# Patient Record
Sex: Male | Born: 1945
Health system: Southern US, Community
[De-identification: ages and names within clinical notes are randomized; demographics above are authoritative.]

## PROBLEM LIST (undated history)

## (undated) DIAGNOSIS — K649 Unspecified hemorrhoids: Secondary | ICD-10-CM

## (undated) DIAGNOSIS — L4050 Arthropathic psoriasis, unspecified: Secondary | ICD-10-CM

## (undated) DIAGNOSIS — R300 Dysuria: Secondary | ICD-10-CM

## (undated) DIAGNOSIS — R509 Fever, unspecified: Secondary | ICD-10-CM

## (undated) DIAGNOSIS — M79609 Pain in unspecified limb: Secondary | ICD-10-CM

## (undated) DIAGNOSIS — B029 Zoster without complications: Secondary | ICD-10-CM

## (undated) DIAGNOSIS — M1990 Unspecified osteoarthritis, unspecified site: Secondary | ICD-10-CM

## (undated) HISTORY — DX: Unspecified hemorrhoids: K64.9

## (undated) HISTORY — DX: Unspecified osteoarthritis, unspecified site: M19.90

## (undated) HISTORY — DX: Arthropathic psoriasis, unspecified: L40.50

## (undated) HISTORY — DX: Pain in unspecified limb: M79.609

## (undated) HISTORY — DX: Zoster without complications: B02.9

## (undated) HISTORY — PX: NO PAST SURGERIES: SHX2092

## (undated) HISTORY — DX: Dysuria: R30.0

## (undated) HISTORY — DX: Fever, unspecified: R50.9

---

## 1999-10-25 ENCOUNTER — Encounter: Payer: Self-pay | Admitting: Internal Medicine

## 1999-10-25 ENCOUNTER — Ambulatory Visit (HOSPITAL_COMMUNITY): Admission: RE | Admit: 1999-10-25 | Discharge: 1999-10-25 | Payer: Self-pay | Admitting: *Deleted

## 2000-01-09 ENCOUNTER — Encounter: Admission: RE | Admit: 2000-01-09 | Discharge: 2000-01-09 | Payer: Self-pay | Admitting: Infectious Diseases

## 2004-08-10 ENCOUNTER — Ambulatory Visit: Payer: Self-pay | Admitting: Gastroenterology

## 2005-07-05 LAB — HM COLONOSCOPY: HM Colonoscopy: NORMAL

## 2006-10-01 ENCOUNTER — Encounter: Admission: RE | Admit: 2006-10-01 | Discharge: 2006-10-01 | Payer: Self-pay | Admitting: Internal Medicine

## 2008-08-11 ENCOUNTER — Encounter: Payer: Self-pay | Admitting: Family Medicine

## 2008-12-10 ENCOUNTER — Ambulatory Visit: Payer: Self-pay | Admitting: Family Medicine

## 2008-12-10 DIAGNOSIS — R300 Dysuria: Secondary | ICD-10-CM

## 2008-12-10 HISTORY — DX: Dysuria: R30.0

## 2008-12-10 LAB — CONVERTED CEMR LAB
Bilirubin Urine: NEGATIVE
Blood in Urine, dipstick: NEGATIVE
Glucose, Urine, Semiquant: NEGATIVE
Ketones, urine, test strip: NEGATIVE
Nitrite: NEGATIVE
Protein, U semiquant: NEGATIVE
Specific Gravity, Urine: 1.01
Urobilinogen, UA: 0.2
pH: 6.5

## 2008-12-15 ENCOUNTER — Ambulatory Visit: Payer: Self-pay | Admitting: Family Medicine

## 2008-12-15 ENCOUNTER — Telehealth: Payer: Self-pay | Admitting: Family Medicine

## 2008-12-15 DIAGNOSIS — B029 Zoster without complications: Secondary | ICD-10-CM

## 2008-12-15 HISTORY — DX: Zoster without complications: B02.9

## 2008-12-30 ENCOUNTER — Telehealth: Payer: Self-pay | Admitting: Family Medicine

## 2010-07-03 ENCOUNTER — Encounter: Payer: Self-pay | Admitting: Internal Medicine

## 2011-03-23 DIAGNOSIS — J029 Acute pharyngitis, unspecified: Secondary | ICD-10-CM | POA: Insufficient documentation

## 2011-03-23 DIAGNOSIS — R509 Fever, unspecified: Secondary | ICD-10-CM

## 2011-03-23 DIAGNOSIS — B09 Unspecified viral infection characterized by skin and mucous membrane lesions: Secondary | ICD-10-CM | POA: Insufficient documentation

## 2011-03-23 HISTORY — DX: Fever, unspecified: R50.9

## 2011-03-30 ENCOUNTER — Inpatient Hospital Stay (INDEPENDENT_AMBULATORY_CARE_PROVIDER_SITE_OTHER)
Admission: RE | Admit: 2011-03-30 | Discharge: 2011-03-30 | Disposition: A | Discharge status: Home / Self Care | Payer: Medicare Other | Source: Ambulatory Visit | Attending: Family Medicine | Admitting: Family Medicine

## 2011-03-30 ENCOUNTER — Emergency Department (HOSPITAL_COMMUNITY)
Admission: EM | Admit: 2011-03-30 | Discharge: 2011-03-30 | Disposition: A | Discharge status: Home / Self Care | Payer: Medicare Other | Attending: Emergency Medicine | Admitting: Emergency Medicine

## 2011-03-30 ENCOUNTER — Emergency Department (HOSPITAL_COMMUNITY): Payer: Medicare Other

## 2011-03-30 DIAGNOSIS — Z79899 Other long term (current) drug therapy: Secondary | ICD-10-CM | POA: Insufficient documentation

## 2011-03-30 DIAGNOSIS — R21 Rash and other nonspecific skin eruption: Secondary | ICD-10-CM | POA: Insufficient documentation

## 2011-03-30 DIAGNOSIS — R509 Fever, unspecified: Secondary | ICD-10-CM | POA: Insufficient documentation

## 2011-03-30 DIAGNOSIS — L4050 Arthropathic psoriasis, unspecified: Secondary | ICD-10-CM | POA: Insufficient documentation

## 2011-03-30 LAB — DIFFERENTIAL
Basophils Relative: 0 % (ref 0–1)
Eosinophils Absolute: 0.1 10*3/uL (ref 0.0–0.7)
Eosinophils Relative: 1 % (ref 0–5)
Lymphocytes Relative: 15 % (ref 12–46)
Monocytes Absolute: 0.6 10*3/uL (ref 0.1–1.0)
Neutrophils Relative %: 79 % — ABNORMAL HIGH (ref 43–77)

## 2011-03-30 LAB — COMPREHENSIVE METABOLIC PANEL
BUN: 15 mg/dL (ref 6–23)
CO2: 22 mEq/L (ref 19–32)
Calcium: 9 mg/dL (ref 8.4–10.5)
Creatinine, Ser: 0.75 mg/dL (ref 0.50–1.35)
GFR calc Af Amer: >90 mL/min (ref >90)
GFR calc non Af Amer: >90 mL/min (ref >90)
Glucose, Bld: 106 mg/dL — ABNORMAL HIGH (ref 70–99)
Total Protein: 7.6 g/dL (ref 6.0–8.3)

## 2011-03-30 LAB — SEDIMENTATION RATE: Sed Rate: 98 mm/hr — ABNORMAL HIGH (ref 0–16)

## 2011-03-30 LAB — CBC
HCT: 34.3 % — ABNORMAL LOW (ref 39.0–52.0)
MCV: 79.8 fL (ref 78.0–100.0)
Platelets: 291 10*3/uL (ref 150–400)
RBC: 4.3 MIL/uL (ref 4.22–5.81)
RDW: 14.4 % (ref 11.5–15.5)
WBC: 12.5 10*3/uL — ABNORMAL HIGH (ref 4.0–10.5)

## 2011-03-30 LAB — URINE MICROSCOPIC-ADD ON

## 2011-03-30 LAB — URINALYSIS, ROUTINE W REFLEX MICROSCOPIC
Bilirubin Urine: NEGATIVE
Nitrite: NEGATIVE
Specific Gravity, Urine: 1.025 (ref 1.005–1.030)
Urobilinogen, UA: 1 mg/dL (ref 0.0–1.0)
pH: 6.5 (ref 5.0–8.0)

## 2011-03-30 LAB — RAPID STREP SCREEN (MED CTR MEBANE ONLY): Streptococcus, Group A Screen (Direct): NEGATIVE

## 2011-03-31 LAB — URINE CULTURE: Culture  Setup Time: 201210181700

## 2011-04-05 ENCOUNTER — Ambulatory Visit (INDEPENDENT_AMBULATORY_CARE_PROVIDER_SITE_OTHER): Payer: Medicare Other | Admitting: Family Medicine

## 2011-04-05 ENCOUNTER — Encounter: Payer: Self-pay | Admitting: Family Medicine

## 2011-04-05 ENCOUNTER — Telehealth: Payer: Self-pay | Admitting: *Deleted

## 2011-04-05 VITALS — BP 102/72 | Temp 98.3°F | Ht 69.25 in | Wt 184.0 lb

## 2011-04-05 DIAGNOSIS — R918 Other nonspecific abnormal finding of lung field: Secondary | ICD-10-CM

## 2011-04-05 DIAGNOSIS — E871 Hypo-osmolality and hyponatremia: Secondary | ICD-10-CM

## 2011-04-05 DIAGNOSIS — R9389 Abnormal findings on diagnostic imaging of other specified body structures: Secondary | ICD-10-CM

## 2011-04-05 DIAGNOSIS — R509 Fever, unspecified: Secondary | ICD-10-CM

## 2011-04-05 DIAGNOSIS — R21 Rash and other nonspecific skin eruption: Secondary | ICD-10-CM

## 2011-04-05 LAB — CBC WITH DIFFERENTIAL/PLATELET
Basophils Relative: 0 % (ref 0–1)
Eosinophils Absolute: 0.3 10*3/uL (ref 0.0–0.7)
HCT: 34.1 % — ABNORMAL LOW (ref 39.0–52.0)
Hemoglobin: 11.4 g/dL — ABNORMAL LOW (ref 13.0–17.0)
MCH: 27.4 pg (ref 26.0–34.0)
MCHC: 33.4 g/dL (ref 30.0–36.0)
Monocytes Absolute: 0.3 10*3/uL (ref 0.1–1.0)
Monocytes Relative: 2 % — ABNORMAL LOW (ref 3–12)
Neutrophils Relative %: 87 % — ABNORMAL HIGH (ref 43–77)
RDW: 14.8 % (ref 11.5–15.5)

## 2011-04-05 LAB — BASIC METABOLIC PANEL
CO2: 21 mEq/L (ref 19–32)
Chloride: 98 mEq/L (ref 96–112)
Creat: 0.84 mg/dL (ref 0.50–1.35)
Glucose, Bld: 93 mg/dL (ref 70–99)

## 2011-04-05 NOTE — Patient Instructions (Signed)
STOP Augmentin at this time Follow promptly for any new symptoms or worsening status.

## 2011-04-05 NOTE — Telephone Encounter (Signed)
Pt has come and gone

## 2011-04-05 NOTE — Progress Notes (Signed)
  Subjective:    Patient ID: Andrew Reid, male    DOB: 01/22/1946, 65 y.o.   MRN: [id number]  HPI  Acute visit. Patient has not been seen here in quite some time. Receiving primary care through [facility]. Seen with persistent fever 2 week duration.  Onset fever exactly 2 weeks ago today. Had some initial mild sore throat. Fever up to 103. Seen at previous primary care office the following day felt to have viral syndrome with negative rapid strep. Initially no antibiotics prescribed but fever persisted and patient started Zithromax that Friday. By Monday (9 days ago) persistent fever and Augmentin called in. Shortly after starting Augmentin noted myalgias calf muscles but no other muscle pain. Patient had persistent fever and this past Thursday went to emergency room. Several studies done including urine and blood cultures which are negative. Rapid strep again repeated and negative. White blood count minimally elevated at 12.5 thousand. Sodium 131. Albumin 2.8. AST mildly elevated 74. Sedimentation rate 98. Chest x-ray no acute findings but mention of right paratracheal prominence with recommended CT scan to further evaluate.  Patient denies any nausea, vomiting, diarrhea, headache, abdominal pain, dysuria, tick bites, adenopathy, recent travels, cough. Mild weight loss of 4 pounds over the past several days. Decreased appetite. 2-3 days ago developed a diffuse erythematous rash on Augmentin. He is still taking Augmentin at this time.  Does have history of psoriatic arthritis followed by rheumatology.  Enbrel injections but has not taken over the past couple of weeks because of his fever.  Review of Systems As per history of present illness    Objective:   Physical Exam  Constitutional: He is oriented to person, place, and time. He appears well-developed and well-nourished.  HENT:  Right Ear: External ear normal.  Left Ear: External ear normal.  Mouth/Throat:  Oropharynx is clear and moist.  Eyes: Pupils are equal, round, and reactive to light. No scleral icterus.  Neck: Normal range of motion. Neck supple.  Cardiovascular: Normal rate and regular rhythm.   No murmur heard. Pulmonary/Chest: Effort normal and breath sounds normal. No respiratory distress. He has no wheezes. He has no rales.  Abdominal: Soft. Bowel sounds are normal. He exhibits no distension and no mass. There is no tenderness. There is no rebound and no guarding.  Musculoskeletal: He exhibits no edema.  Lymphadenopathy:    He has no cervical adenopathy.  Neurological: He is alert and oriented to person, place, and time. No cranial nerve deficit.  Skin: Rash noted.       Diffuse erythematous nonscaly blanching rash mostly involving trunk and upper extremities.  Psychiatric: He has a normal mood and affect. His behavior is normal.          Assessment & Plan:  Persistent fever. Etiology unclear at this time.  Differential viral vs drug fever vs autoimmune vs other.  He has relatively nonfocal exam at this time other than diffuse skin rash. Repeat CBC. Check mono spot and CMV IgM. Discontinue Augmentin at this time. No clear indication for any further antibiotics.  Skin rash. Question drug reaction-most likely,  versus post viral  Abnormal chest x-ray with question of adenopathy right paratracheal. CT chest with contrast to further evaluate as recommended by radiology

## 2011-04-05 NOTE — Telephone Encounter (Signed)
Pt has 2;15 appt this afternoon, FYI

## 2011-04-05 NOTE — Telephone Encounter (Signed)
Daughter, Sport and exercise psychologist, who is a Sports administrator at [employer] called so you would be aware that pt is continuing to spike a fever of 103 and 104 at times- labs have been done and cxr- pt is becoming very fatigued and"warn out" from fever- daughter wanted you to be aware that he has not other sx,except fever and fatigue from fever..she states you may call her at work [phone] if she is needed to answer any questions. Pt has an appointment this pm

## 2011-04-06 ENCOUNTER — Telehealth: Payer: Self-pay | Admitting: Family Medicine

## 2011-04-06 ENCOUNTER — Ambulatory Visit (INDEPENDENT_AMBULATORY_CARE_PROVIDER_SITE_OTHER)
Admission: RE | Admit: 2011-04-06 | Discharge: 2011-04-06 | Disposition: A | Discharge status: Home / Self Care | Payer: Medicare Other | Source: Ambulatory Visit | Attending: Family Medicine | Admitting: Family Medicine

## 2011-04-06 DIAGNOSIS — R590 Localized enlarged lymph nodes: Secondary | ICD-10-CM

## 2011-04-06 DIAGNOSIS — R9389 Abnormal findings on diagnostic imaging of other specified body structures: Secondary | ICD-10-CM

## 2011-04-06 DIAGNOSIS — R918 Other nonspecific abnormal finding of lung field: Secondary | ICD-10-CM

## 2011-04-06 LAB — CULTURE, BLOOD (ROUTINE X 2)
Culture  Setup Time: 201210190159
Culture: NO GROWTH

## 2011-04-06 MED ORDER — IOHEXOL 300 MG/ML  SOLN
80.0000 mL | Freq: Once | INTRAMUSCULAR | Status: AC | PRN
  Administered 2011-04-06: 80 mL via INTRAVENOUS

## 2011-04-06 NOTE — Telephone Encounter (Signed)
Pt and wife informed

## 2011-04-06 NOTE — Progress Notes (Signed)
Quick Note:  Pt and wife informed ______

## 2011-04-06 NOTE — Telephone Encounter (Signed)
Please advise 

## 2011-04-06 NOTE — Telephone Encounter (Signed)
Pt called requesting results of labs

## 2011-04-06 NOTE — Telephone Encounter (Signed)
Pt requesting results of CT scan please contact

## 2011-04-07 ENCOUNTER — Telehealth: Payer: Self-pay | Admitting: *Deleted

## 2011-04-07 NOTE — Telephone Encounter (Signed)
Pt notified.  Diffuse adenopathy.  Recommendation from radiology to consider bronchoscopy for further evaluation.  Pt agrees with this plan. I am making referral.

## 2011-04-07 NOTE — Telephone Encounter (Signed)
VM from wife reporting her husband had a sleepless night due to terrible rash and ongoing sx of fever and body shakes and aches. Initially Pulmonology appt was scheduled for 11/16.  Pt daughter works for [employer] and she is working on getting him in sooner.  Pt asked me to call him back after 3pm to update un on earlier appt status.

## 2011-04-07 NOTE — Telephone Encounter (Signed)
I spoke with pt wife, their daughter was able to move the appt up to 10/30, next Tuesday FYI only

## 2011-04-10 ENCOUNTER — Telehealth: Payer: Self-pay | Admitting: *Deleted

## 2011-04-10 NOTE — Telephone Encounter (Signed)
VM from Cassandra from [facility] Pulmonology office requesting a referral.  Pt is scheduled with them on Tuesday [phone], ext [phone] (phone)

## 2011-04-10 NOTE — Progress Notes (Signed)
Quick Note:  Pt wife informed ______ 

## 2011-04-10 NOTE — Telephone Encounter (Signed)
He already has a referral.  This was made last week.

## 2011-04-10 NOTE — Progress Notes (Signed)
Quick Note:  Wife informed ______

## 2011-04-11 ENCOUNTER — Encounter: Payer: Self-pay | Admitting: Emergency Medicine

## 2011-04-11 ENCOUNTER — Ambulatory Visit (INDEPENDENT_AMBULATORY_CARE_PROVIDER_SITE_OTHER): Payer: Medicare Other | Admitting: Emergency Medicine

## 2011-04-11 VITALS — BP 110/78 | HR 99 | Temp 98.6°F | Ht 70.0 in | Wt 193.2 lb

## 2011-04-11 DIAGNOSIS — J029 Acute pharyngitis, unspecified: Secondary | ICD-10-CM

## 2011-04-11 DIAGNOSIS — R599 Enlarged lymph nodes, unspecified: Secondary | ICD-10-CM

## 2011-04-11 DIAGNOSIS — L4050 Arthropathic psoriasis, unspecified: Secondary | ICD-10-CM | POA: Insufficient documentation

## 2011-04-11 DIAGNOSIS — B09 Unspecified viral infection characterized by skin and mucous membrane lesions: Secondary | ICD-10-CM

## 2011-04-11 DIAGNOSIS — R590 Localized enlarged lymph nodes: Secondary | ICD-10-CM

## 2011-04-11 NOTE — Patient Instructions (Signed)
Dr Delton Coombes reviewed your case with Dr Dierdre Forth, particularly regarding restarting you Embrel. At this time, DO NOT restart this medication.  Dr Delton Coombes will review your CT scan with Dr Edwyna Shell to plan a possible biopsy of your lymph nodes. Dr Delton Coombes will call you with the arrangements.

## 2011-04-11 NOTE — Assessment & Plan Note (Signed)
Symptoms and scan concerning for possible lymphoma, esp w hx Embrel. He will likely need EBUS +/- mediastinoscopy; I will try to coordinate w Dr Edwyna Shell. Regarding the Embrel, some of his sx could be present because he stopped the med. I reviewed case w Dr Dierdre Forth, he doesn't believe being all of these sx can be ascribed to psoriatic arthritis. At this time we agreed to leave him off the embrel, plan for bx. I will call the pt after speaking w Dr Edwyna Shell.

## 2011-04-11 NOTE — Telephone Encounter (Signed)
Cassandra with Dr Richrd Humbles office returned your call.  They have faxed over office visit notes from today.  Requesting a referral to a cardiologist. [phone], ext [phone]

## 2011-04-11 NOTE — Telephone Encounter (Signed)
VM left for Andrew Reid, questioning what she needed from [place] for this pt?

## 2011-04-11 NOTE — Progress Notes (Signed)
Subjective:    Patient ID: Andrew Reid, male    DOB: [birth year], 65 y.o.   MRN: [id number]  HPI 65 yo never smoker w hx psoriatic arthritis on etanercept (has been on for 3.5 yrs). He felt well until about 3 weeks ago after he took his Embrel when he developed fevers, to as high as 103-104. Over the next 3 weeks was treated w azithro and then augmentin. The latter gave him rash, muscle aches, bad taste in mouth. The evaluation has included CXR and then CT scan of the chest - shows mediastinal LAD. He has also had poor appetite, weakness, chills.    Review of Systems  Constitutional: Positive for fever and unexpected weight change.  HENT: Positive for congestion. Negative for ear pain, nosebleeds, sore throat, rhinorrhea, sneezing, trouble swallowing, dental problem, postnasal drip and sinus pressure.   Eyes: Negative.  Negative for redness and itching.  Respiratory: Positive for cough. Negative for chest tightness, shortness of breath and wheezing.   Cardiovascular: Negative.  Negative for palpitations and leg swelling.  Gastrointestinal: Negative.  Negative for nausea and vomiting.  Genitourinary: Negative.  Negative for dysuria.  Musculoskeletal: Positive for joint swelling.  Skin: Positive for rash.  Neurological: Negative.  Negative for headaches.  Hematological: Negative.  Does not bruise/bleed easily.  Psychiatric/Behavioral: Negative.  Negative for dysphoric mood. The patient is not nervous/anxious.    Past Medical History  Diagnosis Date  . HERPES ZOSTER 12/15/2008  . Dysuria 12/10/2008  . Psoriatic arthritis   . Unspecified hemorrhoids without mention of complication   . Pain in limb   . Fever, unspecified 03/23/11     Family History  Problem Relation Age of Onset  . Breast cancer Mother   . Cancer Father     sternum?     History   Social History  . Marital Status: Married    Spouse Name: N/A    Number of Children: N/A  . Years of Education: N/A   Occupational  History  . Retired Psychologist, educational    Social History Main Topics  . Smoking status: Never Smoker   . Smokeless tobacco: Not on file  . Alcohol Use: No  . Drug Use: No  . Sexually Active: Not on file   Other Topics Concern  . Not on file   Social History Narrative  . No narrative on file     Allergies  Allergen Reactions  . Ciprofloxacin Hcl     REACTION: rash groin area  . Augmentin Rash     Outpatient Prescriptions Prior to Visit  Medication Sig Dispense Refill  . meloxicam (MOBIC) 15 MG tablet Take 15 mg by mouth daily.        Marland Kitchen etanercept (ENBREL) 50 MG/ML injection Inject 50 mg into the skin once a week. Dr Dierdre Forth       . amoxicillin-clavulanate (AUGMENTIN) 875-125 MG per tablet Take 1 tablet by mouth 2 (two) times daily. For 10 days per Dr Andrey Campanile            Objective:   Physical Exam  Gen: Pleasant, well-nourished, in no distress,  normal affect  ENT: No lesions,  mouth clear,  oropharynx clear, no postnasal drip  Neck: No JVD, no TMG, no carotid bruits  Lungs: No use of accessory muscles, no dullness to percussion, clear without rales or rhonchi  Cardiovascular: RRR, heart sounds normal, no murmur or gallops, no peripheral edema  Abdomen: soft and NT, no HSM,  BS normal  Musculoskeletal: No deformities,  no cyanosis or clubbing  Neuro: alert, non focal  Skin: Warm, no lesions or rashes      Assessment & Plan:  Mediastinal lymphadenopathy Symptoms and scan concerning for possible lymphoma, esp w hx Embrel. He will likely need EBUS +/- mediastinoscopy; I will try to coordinate w Dr Edwyna Shell. Regarding the Embrel, some of his sx could be present because he stopped the med. I reviewed case w Dr Dierdre Forth, he doesn't believe being all of these sx can be ascribed to psoriatic arthritis. At this time we agreed to leave him off the embrel, plan for bx. I will call the pt after speaking w Dr Edwyna Shell.

## 2011-04-12 ENCOUNTER — Telehealth: Payer: Self-pay | Admitting: Emergency Medicine

## 2011-04-12 DIAGNOSIS — R590 Localized enlarged lymph nodes: Secondary | ICD-10-CM

## 2011-04-12 NOTE — Telephone Encounter (Signed)
I informed patient that Rb stated at 04-11-11 visit that he will need to speak with Dr Edwyna Shell first then make arrangements for possible bx. Pt aware that I have sent this phone note to RB to advise. Thanks.

## 2011-04-12 NOTE — Telephone Encounter (Signed)
Yes, Dr Marjory Lies was entered in error

## 2011-04-12 NOTE — Telephone Encounter (Signed)
Per pulmonary notes, Dr Delton Coombes was setting him up to see Dr Edwyna Shell with [facility].  Make sure this is the correct Gwynneth Albright. I have no knowledge of this Gwynneth Albright seeing Dr Marjory Lies.

## 2011-04-13 ENCOUNTER — Ambulatory Visit: Payer: Medicare Other | Admitting: Internal Medicine

## 2011-04-13 NOTE — Telephone Encounter (Signed)
Reviewed case with Dr Edwyna Shell - pt needs mediastinoscopy. He will see pt on Tues Nov 6 at 11:00. I informed the pt.

## 2011-04-13 NOTE — Telephone Encounter (Signed)
PT CALLED AGAIN. "VERY ANXIOUS TO HEAR FROM DR Delton Coombes OR NURSE TODAY" RE: SETTING UP BIOPSY.

## 2011-04-13 NOTE — Telephone Encounter (Signed)
I spoke with Dr Edwyna Shell, reviewed case with him. We agree that Mr Weigold will benefit most from mediastinoscopy (instead of FOB/EBUS). We will arrange for him to be seen ASAP by Dr Edwyna Shell.

## 2011-04-13 NOTE — Telephone Encounter (Signed)
Pt is aware that Dr. Delton Coombes has spoke with Dr. Edwyna Shell and we will set him up for an appt to see Dr. Edwyna Shell in his office. Order sent to [facility] and they will call him with this appt. The patient wishes to speak with Dr. Delton Coombes about this plan and to discuss being on abx or meds in the meantime.

## 2011-04-18 ENCOUNTER — Ambulatory Visit (INDEPENDENT_AMBULATORY_CARE_PROVIDER_SITE_OTHER): Payer: Medicare Other | Admitting: Thoracic Surgery

## 2011-04-18 ENCOUNTER — Other Ambulatory Visit: Payer: Self-pay

## 2011-04-18 ENCOUNTER — Encounter: Payer: Medicare Other | Admitting: Thoracic Surgery

## 2011-04-18 ENCOUNTER — Encounter: Payer: Self-pay | Admitting: Thoracic Surgery

## 2011-04-18 VITALS — BP 114/77 | HR 96 | Resp 18 | Ht 70.0 in | Wt 171.0 lb

## 2011-04-18 DIAGNOSIS — R590 Localized enlarged lymph nodes: Secondary | ICD-10-CM

## 2011-04-18 DIAGNOSIS — R599 Enlarged lymph nodes, unspecified: Secondary | ICD-10-CM

## 2011-04-18 NOTE — Progress Notes (Signed)
PCP is Kristian Covey, MD Referring Provider is Leslye Peer., MD  Chief Complaint  Patient presents with  . Adenopathy    Referral from Dr Delton Coombes for surgical eval, mediastinal adenopathy    HPI: This 65 year old Caucasian male has had a history of recent weight loss loss of appetite fever as high is 103. CT scan was done that showed an mediastinal and left subcutaneous last dose clavicular adenopathy apparently there was some enlarged mediastinal nodes in 2001. He appears treated with Augmentin with a bad reaction and continues to have problems with his taste after the Augmentin. Also had a rash with the Augmentin. He is referred for for mediastinoscopy and cervical node biopsy. We plan to do these on the seventh at [facility]. Risk of the procedure were explained. Chances of success are good. Patient and family agrees to procedure.  Past Medical History  Diagnosis Date  . HERPES ZOSTER 12/15/2008  . Dysuria 12/10/2008  . Psoriatic arthritis   . Unspecified hemorrhoids without mention of complication   . Pain in limb   . Fever, unspecified 03/23/11    Past Surgical History  Procedure Date  . No past surgeries     Family History  Problem Relation Age of Onset  . Breast cancer Mother   . Cancer Father     sternum?    Social History History  Substance Use Topics  . Smoking status: Never Smoker   . Smokeless tobacco: Not on file  . Alcohol Use: No    Current Outpatient Prescriptions  Medication Sig Dispense Refill  . acetaminophen (TYLENOL) 500 MG tablet Take 500 mg by mouth every 6 (six) hours as needed.        . meloxicam (MOBIC) 15 MG tablet Take 15 mg by mouth daily.          Allergies  Allergen Reactions  . Augmentin Rash    Review of Systems  Constitutional: Positive for fever, activity change, appetite change, fatigue and unexpected weight change.  HENT: Negative.   Eyes: Negative.   Respiratory: Negative.   Cardiovascular: Negative.   Genitourinary:  Negative.   Musculoskeletal: Positive for myalgias, joint swelling and arthralgias.  Neurological: Negative.   Hematological: Negative.   Psychiatric/Behavioral: Negative.     BP 114/77  Pulse 96  Resp 18  Ht 5\' 10"  (1.778 m)  Wt 171 lb (77.565 kg)  BMI 24.54 kg/m2  SpO2 99% Physical Exam  Constitutional: He is oriented to person, place, and time. He appears well-developed and well-nourished.  HENT:  Head: Normocephalic and atraumatic.  Right Ear: External ear normal.  Left Ear: External ear normal.  Mouth/Throat: Oropharynx is clear and moist.  Eyes: Conjunctivae and EOM are normal. Pupils are equal, round, and reactive to light.  Neck: Normal range of motion. Neck supple. No thyromegaly present.  Cardiovascular: Normal rate, regular rhythm and normal heart sounds.   Pulmonary/Chest: Effort normal and breath sounds normal. No respiratory distress.  Abdominal: Soft. Bowel sounds are normal. He exhibits no distension.  Musculoskeletal: He exhibits no edema and no tenderness.  Lymphadenopathy:    He has cervical adenopathy.  Neurological: He is alert and oriented to person, place, and time. He has normal reflexes.  Skin: Skin is warm and dry.  Psychiatric: He has a normal mood and affect. His behavior is normal. Judgment and thought content normal.     Diagnostic Tests: CT scan shows mediastinal and left supraclavicular adenopathy  Impression: Fever of unknown origin   Plan: Mediastinoscopy  and left cervical node biopsy.

## 2011-04-19 ENCOUNTER — Encounter (HOSPITAL_COMMUNITY): Payer: Self-pay | Admitting: *Deleted

## 2011-04-19 ENCOUNTER — Other Ambulatory Visit: Payer: Self-pay

## 2011-04-19 DIAGNOSIS — R599 Enlarged lymph nodes, unspecified: Secondary | ICD-10-CM

## 2011-04-19 MED ORDER — VANCOMYCIN HCL IN DEXTROSE 1-5 GM/200ML-% IV SOLN
1000.0000 mg | INTRAVENOUS | Status: AC
  Administered 2011-04-20: 1000 mg via INTRAVENOUS
  Filled 2011-04-19: qty 200

## 2011-04-20 ENCOUNTER — Encounter (HOSPITAL_COMMUNITY)
Admission: RE | Disposition: A | Discharge status: Home / Self Care | Payer: Self-pay | Source: Ambulatory Visit | Attending: Thoracic Surgery

## 2011-04-20 ENCOUNTER — Encounter (HOSPITAL_COMMUNITY): Payer: Self-pay

## 2011-04-20 ENCOUNTER — Other Ambulatory Visit: Payer: Self-pay

## 2011-04-20 ENCOUNTER — Other Ambulatory Visit: Payer: Self-pay | Admitting: Thoracic Surgery

## 2011-04-20 ENCOUNTER — Ambulatory Visit (HOSPITAL_COMMUNITY)
Admission: RE | Admit: 2011-04-20 | Discharge: 2011-04-20 | Disposition: A | Discharge status: Home / Self Care | Payer: Medicare Other | Source: Ambulatory Visit | Attending: Thoracic Surgery | Admitting: Thoracic Surgery

## 2011-04-20 ENCOUNTER — Ambulatory Visit (HOSPITAL_COMMUNITY): Payer: Medicare Other

## 2011-04-20 ENCOUNTER — Encounter (HOSPITAL_COMMUNITY): Payer: Self-pay | Admitting: *Deleted

## 2011-04-20 DIAGNOSIS — R599 Enlarged lymph nodes, unspecified: Secondary | ICD-10-CM

## 2011-04-20 DIAGNOSIS — Z01818 Encounter for other preprocedural examination: Secondary | ICD-10-CM | POA: Insufficient documentation

## 2011-04-20 DIAGNOSIS — R509 Fever, unspecified: Secondary | ICD-10-CM | POA: Insufficient documentation

## 2011-04-20 HISTORY — PX: VIDEO BRONCHOSCOPY: SHX5072

## 2011-04-20 HISTORY — PX: VIDEO MEDIASTINOSCOPY: SHX5071

## 2011-04-20 LAB — SURGICAL PCR SCREEN
MRSA, PCR: NEGATIVE
Staphylococcus aureus: NEGATIVE

## 2011-04-20 LAB — TYPE AND SCREEN
ABO/RH(D): O POS
Antibody Screen: NEGATIVE

## 2011-04-20 LAB — CBC
HCT: 29.4 % — ABNORMAL LOW (ref 39.0–52.0)
Hemoglobin: 9.9 g/dL — ABNORMAL LOW (ref 13.0–17.0)
MCHC: 33.7 g/dL (ref 30.0–36.0)
MCV: 79.5 fL (ref 78.0–100.0)
RDW: 15.6 % — ABNORMAL HIGH (ref 11.5–15.5)

## 2011-04-20 LAB — COMPREHENSIVE METABOLIC PANEL
Alkaline Phosphatase: 115 U/L (ref 39–117)
BUN: 9 mg/dL (ref 6–23)
Creatinine, Ser: 0.84 mg/dL (ref 0.50–1.35)
GFR calc Af Amer: >90 mL/min (ref >90)
Glucose, Bld: 109 mg/dL — ABNORMAL HIGH (ref 70–99)
Potassium: 4.4 mEq/L (ref 3.5–5.1)
Total Bilirubin: 0.5 mg/dL (ref 0.3–1.2)
Total Protein: 7.2 g/dL (ref 6.0–8.3)

## 2011-04-20 LAB — ABO/RH: ABO/RH(D): O POS

## 2011-04-20 LAB — APTT: aPTT: 40 seconds — ABNORMAL HIGH (ref 24–37)

## 2011-04-20 LAB — PROTIME-INR
INR: 1.22 (ref 0.00–1.49)
Prothrombin Time: 15.7 seconds — ABNORMAL HIGH (ref 11.6–15.2)

## 2011-04-20 SURGERY — BRONCHOSCOPY, VIDEO-ASSISTED
Anesthesia: General | Site: Bronchus | Wound class: Clean Contaminated

## 2011-04-20 MED ORDER — OXYCODONE HCL 5 MG PO TABS: 5.0000 mg | ORAL_TABLET | ORAL | Status: DC | PRN

## 2011-04-20 MED ORDER — ACETAMINOPHEN 325 MG PO TABS: 650.0000 mg | ORAL_TABLET | ORAL | Status: DC | PRN

## 2011-04-20 MED ORDER — PROPOFOL 10 MG/ML IV EMUL
INTRAVENOUS | Status: DC | PRN
  Administered 2011-04-20: 200 mg via INTRAVENOUS

## 2011-04-20 MED ORDER — ONDANSETRON HCL 4 MG/2ML IJ SOLN: 4.0000 mg | Freq: Once | INTRAMUSCULAR | Status: DC | PRN

## 2011-04-20 MED ORDER — MUPIROCIN 2 % EX OINT
TOPICAL_OINTMENT | Freq: Two times a day (BID) | CUTANEOUS | Status: DC
  Administered 2011-04-20: 09:00:00 via NASAL

## 2011-04-20 MED ORDER — SODIUM CHLORIDE 0.9 % IJ SOLN: 3.0000 mL | Freq: Two times a day (BID) | INTRAMUSCULAR | Status: DC

## 2011-04-20 MED ORDER — ONDANSETRON HCL 4 MG/2ML IJ SOLN
INTRAMUSCULAR | Status: DC | PRN
  Administered 2011-04-20: 4 mg via INTRAVENOUS

## 2011-04-20 MED ORDER — SODIUM CHLORIDE 0.9 % IJ SOLN: 3.0000 mL | INTRAMUSCULAR | Status: DC | PRN

## 2011-04-20 MED ORDER — SODIUM CHLORIDE 0.9 % IV SOLN
250.0000 mL | INTRAVENOUS | Status: DC
Start: 2011-04-20 — End: 2011-04-20

## 2011-04-20 MED ORDER — GLYCOPYRROLATE 0.2 MG/ML IJ SOLN
INTRAMUSCULAR | Status: DC | PRN
  Administered 2011-04-20: .6 mg via INTRAVENOUS

## 2011-04-20 MED ORDER — ACETAMINOPHEN 650 MG RE SUPP: 650.0000 mg | RECTAL | Status: DC | PRN

## 2011-04-20 MED ORDER — ONDANSETRON HCL 4 MG/2ML IJ SOLN: 4.0000 mg | Freq: Four times a day (QID) | INTRAMUSCULAR | Status: DC | PRN

## 2011-04-20 MED ORDER — LACTATED RINGERS IV SOLN
INTRAVENOUS | Status: DC | PRN
  Administered 2011-04-20: 11:00:00 via INTRAVENOUS

## 2011-04-20 MED ORDER — HEMOSTATIC AGENTS (NO CHARGE) OPTIME
TOPICAL | Status: DC | PRN
  Administered 2011-04-20: 1 via TOPICAL

## 2011-04-20 MED ORDER — NEOSTIGMINE METHYLSULFATE 1 MG/ML IJ SOLN
INTRAMUSCULAR | Status: DC | PRN
  Administered 2011-04-20: 4 mg via INTRAVENOUS

## 2011-04-20 MED ORDER — HYDROMORPHONE HCL PF 1 MG/ML IJ SOLN: 0.2500 mg | INTRAMUSCULAR | Status: DC | PRN

## 2011-04-20 MED ORDER — FENTANYL CITRATE 0.05 MG/ML IJ SOLN
INTRAMUSCULAR | Status: DC | PRN
  Administered 2011-04-20: 50 ug via INTRAVENOUS
  Administered 2011-04-20: 100 ug via INTRAVENOUS
  Administered 2011-04-20: 50 ug via INTRAVENOUS

## 2011-04-20 MED ORDER — SODIUM CHLORIDE 0.9 % IR SOLN
Status: DC | PRN
  Administered 2011-04-20: 1000 mL

## 2011-04-20 MED ORDER — FENTANYL CITRATE 0.05 MG/ML IJ SOLN: 25.0000 ug | INTRAMUSCULAR | Status: DC | PRN

## 2011-04-20 MED ORDER — PROMETHAZINE HCL 25 MG/ML IJ SOLN: 12.5000 mg | Freq: Four times a day (QID) | INTRAMUSCULAR | Status: DC | PRN

## 2011-04-20 MED ORDER — ROCURONIUM BROMIDE 100 MG/10ML IV SOLN
INTRAVENOUS | Status: DC | PRN
  Administered 2011-04-20: 40 mg via INTRAVENOUS

## 2011-04-20 SURGICAL SUPPLY — 56 items
ADH SKN CLS APL DERMABOND .7 (GAUZE/BANDAGES/DRESSINGS) ×3
BALL CTTN LRG ABS STRL LF (GAUZE/BANDAGES/DRESSINGS)
BRUSH CYTOL CELLEBRITY 1.5X140 (MISCELLANEOUS) IMPLANT
CANISTER SUCTION 2500CC (MISCELLANEOUS) ×8 IMPLANT
CLIP TI MEDIUM 6 (CLIP) ×4 IMPLANT
CLOTH BEACON ORANGE TIMEOUT ST (SAFETY) ×8 IMPLANT
CONT SPEC 4OZ CLIKSEAL STRL BL (MISCELLANEOUS) ×8 IMPLANT
COTTONBALL LRG STERILE PKG (GAUZE/BANDAGES/DRESSINGS) IMPLANT
COVER SURGICAL LIGHT HANDLE (MISCELLANEOUS) ×8 IMPLANT
COVER TABLE BACK 60X90 (DRAPES) ×4 IMPLANT
DERMABOND ADVANCED (GAUZE/BANDAGES/DRESSINGS) ×1
DERMABOND ADVANCED .7 DNX12 (GAUZE/BANDAGES/DRESSINGS) ×3 IMPLANT
DRAPE CHEST BREAST 15X10 FENES (DRAPES) ×4 IMPLANT
ELECT REM PT RETURN 9FT ADLT (ELECTROSURGICAL) ×4
ELECTRODE REM PT RTRN 9FT ADLT (ELECTROSURGICAL) ×3 IMPLANT
FILTER STRAW FLUID ASPIR (MISCELLANEOUS) IMPLANT
FORCEPS BIOP RJ4 1.8 (CUTTING FORCEPS) IMPLANT
GLOVE BIOGEL M 6.5 STRL (GLOVE) ×2 IMPLANT
GLOVE BIOGEL PI IND STRL 6.5 (GLOVE) ×1 IMPLANT
GLOVE BIOGEL PI INDICATOR 6.5 (GLOVE) ×1
GLOVE SURG SIGNA 7.5 PF LTX (GLOVE) ×8 IMPLANT
GOWN BRE IMP PREV XXLGXLNG (GOWN DISPOSABLE) ×4 IMPLANT
GOWN STRL NON-REIN LRG LVL3 (GOWN DISPOSABLE) ×8 IMPLANT
HEMOSTAT SURGICEL 2X14 (HEMOSTASIS) ×2 IMPLANT
KIT BASIN OR (CUSTOM PROCEDURE TRAY) ×4 IMPLANT
KIT ROOM TURNOVER OR (KITS) ×6 IMPLANT
MARKER SKIN DUAL TIP RULER LAB (MISCELLANEOUS) ×4 IMPLANT
NDL BIOPSY TRANSBRONCH 21G (NEEDLE) IMPLANT
NDL BLUNT 18X1 FOR OR ONLY (NEEDLE) IMPLANT
NEEDLE 22X1 1/2 (OR ONLY) (NEEDLE) IMPLANT
NEEDLE BIOPSY TRANSBRONCH 21G (NEEDLE) IMPLANT
NEEDLE BLUNT 18X1 FOR OR ONLY (NEEDLE) IMPLANT
NS IRRIG 1000ML POUR BTL (IV SOLUTION) ×8 IMPLANT
OIL SILICONE PENTAX (PARTS (SERVICE/REPAIRS)) ×2 IMPLANT
PACK GENERAL/GYN (CUSTOM PROCEDURE TRAY) ×4 IMPLANT
PAD ARMBOARD 7.5X6 YLW CONV (MISCELLANEOUS) ×8 IMPLANT
SPONGE GAUZE 2X2 8PLY STRL LF (GAUZE/BANDAGES/DRESSINGS) ×2 IMPLANT
SPONGE GAUZE 4X4 12PLY (GAUZE/BANDAGES/DRESSINGS) ×4 IMPLANT
SPONGE INTESTINAL PEANUT (DISPOSABLE) ×4 IMPLANT
SUT SILK 2 0 TIES 10X30 (SUTURE) ×4 IMPLANT
SUT VIC AB 2-0 CT1 27 (SUTURE) ×4
SUT VIC AB 2-0 CT1 TAPERPNT 27 (SUTURE) ×3 IMPLANT
SUT VIC AB 3-0 SH 27 (SUTURE) ×4
SUT VIC AB 3-0 SH 27X BRD (SUTURE) ×3 IMPLANT
SYR 20ML ECCENTRIC (SYRINGE) ×4 IMPLANT
SYR 5ML LUER SLIP (SYRINGE) ×4 IMPLANT
SYR CONTROL 10ML LL (SYRINGE) IMPLANT
TAPE CLOTH SURG 4X10 WHT LF (GAUZE/BANDAGES/DRESSINGS) ×2 IMPLANT
TOWEL OR 17X24 6PK STRL BLUE (TOWEL DISPOSABLE) ×8 IMPLANT
TOWEL OR 17X26 10 PK STRL BLUE (TOWEL DISPOSABLE) ×4 IMPLANT
TRAP SPECIMEN MUCOUS 40CC (MISCELLANEOUS) ×4 IMPLANT
TUBE CONNECTING 12X1/4 (SUCTIONS) ×4 IMPLANT
VALVE BIOPSY  SINGLE USE (MISCELLANEOUS)
VALVE BIOPSY SINGLE USE (MISCELLANEOUS) IMPLANT
VALVE SUCTION BRONCHIO DISP (MISCELLANEOUS) IMPLANT
WATER STERILE IRR 1000ML POUR (IV SOLUTION) ×4 IMPLANT

## 2011-04-20 NOTE — OR Nursing (Signed)
Hemostatic agent,surgicel added to supplies

## 2011-04-20 NOTE — Transfer of Care (Signed)
Immediate Anesthesia Transfer of Care Note  Patient: Andrew Reid  Procedure(s) Performed:  VIDEO BRONCHOSCOPY; VIDEO MEDIASTINOSCOPY  Patient Location: PACU  Anesthesia Type: General  Level of Consciousness: awake  Airway & Oxygen Therapy: Patient Spontanous Breathing  Post-op Assessment: Report given to PACU RN  Post vital signs: stable  Complications: No apparent anesthesia complications

## 2011-04-20 NOTE — Anesthesia Preprocedure Evaluation (Addendum)
Anesthesia Evaluation  Patient identified by MRN, date of birth, ID band Patient awake    Reviewed: Allergy & Precautions, H&P , NPO status , Patient's Chart, lab work & pertinent test results, reviewed documented beta blocker date and time   History of Anesthesia Complications Negative for: history of anesthetic complications  Airway Mallampati: I TM Distance: >3 FB Neck ROM: full    Dental  (+) Teeth Intact   Pulmonary neg pulmonary ROS,          Cardiovascular Exercise Tolerance: Good neg cardio ROS regular Normal    Neuro/Psych Negative Neurological ROS  Negative Psych ROS   GI/Hepatic negative GI ROS, Neg liver ROS,   Endo/Other  Negative Endocrine ROS  Renal/GU negative Renal ROS  Genitourinary negative   Musculoskeletal negative musculoskeletal ROS (+)   Abdominal   Peds  Hematology negative hematology ROS (+)   Anesthesia Other Findings   Reproductive/Obstetrics negative OB ROS                          Anesthesia Physical Anesthesia Plan  ASA: I  Anesthesia Plan: General   Post-op Pain Management:    Induction: Intravenous  Airway Management Planned: Oral ETT  Additional Equipment:   Intra-op Plan:   Post-operative Plan:   Informed Consent: I have reviewed the patients History and Physical, chart, labs and discussed the procedure including the risks, benefits and alternatives for the proposed anesthesia with the patient or authorized representative who has indicated his/her understanding and acceptance.   Dental Advisory Given  Plan Discussed with: CRNA, Anesthesiologist and Surgeon  Anesthesia Plan Comments:        Anesthesia Quick Evaluation

## 2011-04-20 NOTE — Anesthesia Postprocedure Evaluation (Signed)
  Anesthesia Post-op Note  Patient: Andrew Reid  Procedure(s) Performed:  VIDEO BRONCHOSCOPY; VIDEO MEDIASTINOSCOPY  Patient Location: PACU  Anesthesia Type: General  Level of Consciousness: awake, alert , oriented and patient cooperative  Airway and Oxygen Therapy: Patient Spontanous Breathing and Patient connected to nasal cannula oxygen  Post-op Pain: mild  Post-op Assessment: Post-op Vital signs reviewed, Patient's Cardiovascular Status Stable, Respiratory Function Stable, Patent Airway and No signs of Nausea or vomiting  Post-op Vital Signs: stable  Complications: No apparent anesthesia complications

## 2011-04-20 NOTE — OR Nursing (Signed)
Scalene node biopsy not perform

## 2011-04-20 NOTE — Brief Op Note (Signed)
04/20/2011  11:30 AM  PATIENT:  Gwynneth Albright  65 y.o. male  PRE-OPERATIVE DIAGNOSIS:  Mediastinal Adenopathy  POST-OPERATIVE DIAGNOSIS:  Mediastinal Adenopathy  PROCEDURE:  Procedure(s): VIDEO BRONCHOSCOPY VIDEO MEDIASTINOSCOPY  SURGEON:  Surgeon(s): D Karle Plumber, MD D Karle Plumber, MD  PHYSICIAN ASSISTANT: None   ASSISTANTS: none none  ANESTHESIA:   general  EBL:  Total I/O In: 800 [I.V.:800] Out: -   BLOOD ADMINISTERED:none  DRAINS: none   LOCAL MEDICATIONS USED:  NONE  SPECIMEN:  Biopsy / Limited Resection  DISPOSITION OF SPECIMEN:  PATHOLOGY  COUNTS:  YES  TOURNIQUET:  * No tourniquets in log *  DICTATION: .Other Dictation: Dictation Number [id number]  PLAN OF CARE: Discharge to home after PACU  PATIENT DISPOSITION:  PACU - hemodynamically stable.   Delay start of Pharmacological VTE agent (>24hrs) due to surgical blood loss or risk of bleeding:  {YES/

## 2011-04-20 NOTE — Preoperative (Signed)
Beta Blockers   Reason not to administer Beta Blockers:Not Applicable 

## 2011-04-20 NOTE — H&P (Signed)
, MD Referring Provider is Leslye Peer., MD    Chief Complaint   Patient presents with   .  Adenopathy       Referral from Dr Delton Coombes for surgical eval, mediastinal adenopathy        HPI: This 65 year old Caucasian male has had a history of recent weight loss loss of appetite fever as high is 103. CT scan was done that showed an mediastinal and left subcutaneous last dose clavicular adenopathy apparently there was some enlarged mediastinal nodes in 2001. He appears treated with Augmentin with a bad reaction and continues to have problems with his taste after the Augmentin. Also had a rash with the Augmentin. He is referred for for mediastinoscopy and cervical node biopsy. We plan to do these on the seventh at [facility]. Risk of the procedure were explained. Chances of success are good. Patient and family agrees to procedure.    Past Medical History   Diagnosis  Date   .  HERPES ZOSTER  12/15/2008   .  Dysuria  12/10/2008   .  Psoriatic arthritis     .  Unspecified hemorrhoids without mention of complication     .  Pain in limb     .  Fever, unspecified  03/23/11         Past Surgical History   Procedure  Date   .  No past surgeries           Family History   Problem  Relation  Age of Onset   .  Breast cancer  Mother     .  Cancer  Father         sternum?        Social History History   Substance Use Topics   .  Smoking status:  Never Smoker    .  Smokeless tobacco:  Not on file   .  Alcohol Use:  No         Current Outpatient Prescriptions   Medication  Sig  Dispense  Refill   .  acetaminophen (TYLENOL) 500 MG tablet  Take 500 mg by mouth every 6 (six) hours as needed.           .  meloxicam (MOBIC) 15 MG tablet  Take 15 mg by mouth daily.                 Allergies   Allergen  Reactions   .  Augmentin  Rash        Review of Systems  Constitutional: Positive for fever, activity change, appetite change, fatigue and unexpected weight change.    HENT: Negative.   Eyes: Negative.   Respiratory: Negative.   Cardiovascular: Negative.   Genitourinary: Negative.   Musculoskeletal: Positive for myalgias, joint swelling and arthralgias.  Neurological: Negative.   Hematological: Negative.   Psychiatric/Behavioral: Negative.       BP 114/77  Pulse 96  Resp 18  Ht 5\' 10"  (1.778 m)  Wt 171 lb (77.565 kg)  BMI 24.54 kg/m2  SpO2 99% Physical Exam  Constitutional: He is oriented to person, place, and time. He appears well-developed and well-nourished.  HENT:   Head: Normocephalic and atraumatic.  Right Ear: External ear normal.  Left Ear: External ear normal.   Mouth/Throat: Oropharynx is clear and moist.  Eyes: Conjunctivae and EOM are normal. Pupils are equal, round, and reactive to light.  Neck: Normal range of motion. Neck supple. No thyromegaly present.  Cardiovascular: Normal rate,  regular rhythm and normal heart sounds.   Pulmonary/Chest: Effort normal and breath sounds normal. No respiratory distress.  Abdominal: Soft. Bowel sounds are normal. He exhibits no distension.  Musculoskeletal: He exhibits no edema and no tenderness.  Lymphadenopathy:    He has cervical adenopathy.  Neurological: He is alert and oriented to person, place, and time. He has normal reflexes.  Skin: Skin is warm and dry.  Psychiatric: He has a normal mood and affect. His behavior is normal. Judgment and thought content normal.        Diagnostic Tests: CT scan shows mediastinal and left supraclavicular adenopathy   Impression: Fever of unknown origin     Plan: Mediastinoscopy and left cervical node biopsy.        Progress Notes signed by Leslye Peer, MD at 04/11/2011  2:39 PM     Created By: Leslye Peer, MD

## 2011-04-20 NOTE — OR Nursing (Signed)
1)Video Bronchoscopy-start time-1048 am & ended @ 1050 2)Video Mediastinoscopy-start time 1102 & ended @ 1123

## 2011-04-20 NOTE — Op Note (Signed)
NAME:  Andrew Reid, Andrew Reid               ACCOUNT NO.:  [id number]  MEDICAL RECORD NO.:  [id number]  LOCATION:  [facility]                         FACILITY:  [facility]  PHYSICIAN:  Ines Bloomer, M.D. DATE OF BIRTH:  [birth year]  DATE OF PROCEDURE:  04/20/2011 DATE OF DISCHARGE:                              OPERATIVE REPORT   PREOPERATIVE DIAGNOSES: 1. Fever of unknown origin. 2. Mediastinal adenopathy.  POSTOPERATIVE DIAGNOSES: 1. Fever of unknown origin. 2. Mediastinal adenopathy.  OPERATION PERFORMED:  Mediastinoscopy and bronchoscopy.  DESCRIPTION OF PROCEDURE:  After general anesthesia, the video bronchoscope was passed through the endotracheal tube.  The carina was in midline.  The left upper lobe and left lower lobe orifices were normal.  The right upper lobe, right middle lobe, and right lower lobe orifices were normal.  Cultures were taken from the bronchial washings as well as for cytology.  The video bronchoscope was removed.  The anterior neck was prepped and draped in usual sterile manner.  A transverse incision was made and dissection was carried down through the subcutaneous tissue to the pretracheal fascia.  A visual exploration was carried out and we found that there was a 2R and 4R mediastinal nodes. There were multiple biopsies were taken with some sent for culture for bronchus and AFB.  Strap muscles were closed with 2-0 Vicryl, subcutaneous tissue with 3-0 Vicryl, and Dermabond for the skin.     Ines Bloomer, M.D.     DPB/MEDQ  D:  04/20/2011  T:  04/20/2011  Job:  [id number]

## 2011-04-23 LAB — TISSUE CULTURE
Culture: NO GROWTH
Gram Stain: NONE SEEN

## 2011-04-23 LAB — CULTURE, RESPIRATORY W GRAM STAIN

## 2011-04-25 ENCOUNTER — Encounter: Payer: Self-pay | Admitting: Internal Medicine

## 2011-04-25 ENCOUNTER — Telehealth: Payer: Self-pay | Admitting: *Deleted

## 2011-04-25 ENCOUNTER — Ambulatory Visit: Payer: Medicare Other | Admitting: Internal Medicine

## 2011-04-25 ENCOUNTER — Encounter: Payer: Self-pay | Admitting: Thoracic Surgery

## 2011-04-25 ENCOUNTER — Ambulatory Visit (INDEPENDENT_AMBULATORY_CARE_PROVIDER_SITE_OTHER): Payer: Self-pay | Admitting: Thoracic Surgery

## 2011-04-25 ENCOUNTER — Ambulatory Visit (INDEPENDENT_AMBULATORY_CARE_PROVIDER_SITE_OTHER): Payer: Medicare Other | Admitting: Internal Medicine

## 2011-04-25 VITALS — BP 104/74 | HR 96 | Resp 20 | Ht 70.0 in | Wt 171.0 lb

## 2011-04-25 DIAGNOSIS — Z9889 Other specified postprocedural states: Secondary | ICD-10-CM

## 2011-04-25 DIAGNOSIS — R509 Fever, unspecified: Secondary | ICD-10-CM

## 2011-04-25 DIAGNOSIS — R599 Enlarged lymph nodes, unspecified: Secondary | ICD-10-CM

## 2011-04-25 NOTE — Progress Notes (Signed)
HPI the patient returns today after mediastinoscopy. All of his tests showed lymphoid hyperplasia. Cultures are still pending. He received vancomycin at the time of his procedure. He says since receiving vancomycin he has improved. He still is having temperature at night of 102. I feel he needs a workup for fever of unknown origin. I feel he needs a workup by infectious disease. He will see Dr. Orvan Falconer today.   Current Outpatient Prescriptions  Medication Sig Dispense Refill  . acetaminophen (TYLENOL) 500 MG tablet Take 500 mg by mouth every 6 (six) hours as needed.        Marland Kitchen HYDROcodone-acetaminophen (LORTAB) 7.5-500 MG per tablet Take 1 tablet by mouth every 6 (six) hours as needed.        . meloxicam (MOBIC) 15 MG tablet Take 15 mg by mouth daily.           Review of Systems: Is still continues to have temperature of 101- 102   Physical Exam mediastinoscopy incision was healing well. Lungs are clear to auscultation percussion   Diagnostic Tests: Biopsies will lymphoid hyperplasia   Impression: Fever of unknown origin lymphadenopathy   Plan: Refer to infectious disease. Return in 3 weeks

## 2011-04-25 NOTE — Assessment & Plan Note (Signed)
At this time the etiology of the fever remains unclear. He has had appropriate cultures sent and the lymphadenopathy and to date no positive results. He has had a CAT scan and other lab work that is unrevealing to date. In discussion with the patient has no obvious etiology. Of course in somebody on Enbrel is always a risk of tuberculosis and with the elevated LFTs there is concern for hepatitis. He does have psoriatic arthritis and therefore gently rheumatic causes of the fever can be at play. He did seem to respond to some degree to IV vancomycin however content he continues to have the fever, just has had less night sweats and chills. He's had no significant travel history or exposures to ticks or mosquitoes. He has no symptoms including diet no diarrhea or other localizing symptoms. I have discussed with him the difficulty in finding etiologies in many of these cases. I will work him up with laboratory work as well as repeat blood cultures and an ultrasound of his liver appeared. He'll also have her return in a week's time to discuss the results and I will continue to follow his cultures.  Thank you for this interesting and perplexing consult.

## 2011-04-25 NOTE — Progress Notes (Signed)
Addended by: Mariea Clonts D on: 04/25/2011 04:15 PM   Modules accepted: Orders

## 2011-04-25 NOTE — Patient Instructions (Signed)
Follow up in 1 week

## 2011-04-25 NOTE — Telephone Encounter (Signed)
Dawn, nurse with Dr. Arva Chafe called asking that we make an appt for this man asap for FUO. Their # is [id number]. The notes are not in the chart yet as he was just seen this am. I spoke with the office manager here. When we get the notes, she will ask one of the mds here to review & decide if referral here is appropriate. The pt's number is [phone]

## 2011-04-25 NOTE — Progress Notes (Signed)
Subjective:    Patient ID: Andrew Reid, male    DOB: 1946-01-04, 65 y.o.   MRN: [id number]  HPI this 65 year old male comes to me with a history of report a 35 day history of fevers. At that time he noted increasing fevers up to 100-203F and also has had significant episodes of sweating at night and a 15 pound weight loss. He never had a history of this before. He recently has been evaluated by his primary physician and pulmonologist who noted significant pulmonary lymphadenopathy and underwent bronchoscopy along with lymph node biopsies by thoracic surgery with results so far negative. Concern has been for lymphoma as well as infection and biopsies and workup to date has included a CAT scan, lymph node biopsies and pathology. To date only significant finding was lymphocytes and the biopsy. That lymphocytes have been cultured for AFB, fungal as well as bacterial and results are still pending. The patient has no history of travel outside of [place] in the last year no, no history of [employer] or travel outside the country in developing countries [place] remotely or recently. He's had no sick contacts and has never had a history of this before. In review of his history though he has had a CAT scan in 2001 that did have also lymphadenopathy in his pulmonary region that was not biopsied at that time. In comparison his lymphadenopathy now is somewhat more pronounced though difficult to truly compare. He has had a course of azithromycin which did no improvement and Augmentin which caused him to have hives. He does have a history of mildly elevated LFTs as well and most recently has a LT was 78.    Review of Systems  Constitutional: Positive for fever, chills, appetite change, fatigue and unexpected weight change. Negative for activity change.  HENT: Negative for ear pain, congestion, facial swelling, rhinorrhea and sinus pressure.   Eyes: Negative for discharge and redness.  Respiratory:  Negative for cough, chest tightness, shortness of breath and wheezing.   Cardiovascular: Negative for chest pain, palpitations and leg swelling.  Gastrointestinal: Negative for nausea, abdominal pain, diarrhea, constipation and abdominal distention.  Genitourinary: Negative for dysuria, frequency, discharge, difficulty urinating and genital sores.  Musculoskeletal: Negative for myalgias and arthralgias.  Skin: Negative for pallor and rash.  Neurological: Negative for headaches.  Psychiatric/Behavioral: Negative for dysphoric mood.       Objective:   Physical Exam  Constitutional: He is oriented to person, place, and time. He appears well-developed and well-nourished. No distress.  HENT:       Thrush noted on tongue No petechaie  Neck: No thyromegaly present.  Cardiovascular: Normal rate, regular rhythm and normal heart sounds.  Exam reveals no gallop and no friction rub.   No murmur heard. Pulmonary/Chest: Effort normal and breath sounds normal. No respiratory distress. He has no wheezes. He has no rales.  Abdominal: Soft. Bowel sounds are normal. He exhibits no distension. There is no tenderness. There is no rebound.  Genitourinary: Penis normal. No penile tenderness.  Musculoskeletal:       Mild finger arthritis  Lymphadenopathy:    He has no cervical adenopathy.  Neurological: He is alert and oriented to person, place, and time. No cranial nerve deficit.  Skin: Skin is warm and dry. No rash noted. No erythema.       No Osler nodes, no nail hemorrhages  Psychiatric: He has a normal mood and affect. His behavior is normal.  Assessment & Plan:

## 2011-04-26 ENCOUNTER — Encounter (HOSPITAL_COMMUNITY): Payer: Self-pay | Admitting: Thoracic Surgery

## 2011-04-26 ENCOUNTER — Telehealth: Payer: Self-pay | Admitting: Licensed Clinical Social Worker

## 2011-04-26 LAB — URINALYSIS, ROUTINE W REFLEX MICROSCOPIC
Ketones, ur: NEGATIVE mg/dL
Leukocytes, UA: NEGATIVE
Nitrite: NEGATIVE
Specific Gravity, Urine: 1.016 (ref 1.005–1.030)
Urobilinogen, UA: 1 mg/dL (ref 0.0–1.0)
pH: 6.5 (ref 5.0–8.0)

## 2011-04-26 LAB — URINALYSIS, MICROSCOPIC ONLY
Bacteria, UA: NONE SEEN
Squamous Epithelial / LPF: NONE SEEN

## 2011-04-26 LAB — HEPATITIS PANEL, ACUTE
Hep A IgM: NEGATIVE
Hep B C IgM: NEGATIVE
Hepatitis B Surface Ag: NEGATIVE

## 2011-04-26 LAB — HIV-1 RNA QUANT-NO REFLEX-BLD: HIV 1 RNA Quant: NOT DETECTED copies/mL (ref <20)

## 2011-04-26 NOTE — Telephone Encounter (Signed)
Everything is negative so far, most will take several days to come back.

## 2011-04-26 NOTE — Telephone Encounter (Signed)
Patient is starting to have chills and night sweats again, he is very concerned and he would like to know if any of the test results are back.

## 2011-04-27 ENCOUNTER — Ambulatory Visit (HOSPITAL_COMMUNITY)
Admission: RE | Admit: 2011-04-27 | Discharge: 2011-04-27 | Disposition: A | Discharge status: Home / Self Care | Payer: Medicare Other | Source: Ambulatory Visit | Attending: Internal Medicine | Admitting: Internal Medicine

## 2011-04-27 ENCOUNTER — Telehealth: Payer: Self-pay | Admitting: *Deleted

## 2011-04-27 DIAGNOSIS — K8020 Calculus of gallbladder without cholecystitis without obstruction: Secondary | ICD-10-CM | POA: Insufficient documentation

## 2011-04-27 DIAGNOSIS — R509 Fever, unspecified: Secondary | ICD-10-CM | POA: Insufficient documentation

## 2011-04-27 LAB — PROTEIN ELECTROPHORESIS, SERUM
Albumin ELP: 32.7 % — ABNORMAL LOW (ref 55.8–66.1)
Alpha-2-Globulin: 18.2 % — ABNORMAL HIGH (ref 7.1–11.8)
Beta 2: 8.5 % — ABNORMAL HIGH (ref 3.2–6.5)
Beta Globulin: 4.3 % — ABNORMAL LOW (ref 4.7–7.2)

## 2011-04-27 NOTE — Telephone Encounter (Signed)
Wife had left a message for md to call them. I called back & spoke with pt. States he feels worse & weak. Temps up to 102.6. Using otc antipyretics. States they do not help. He went for an [place] today & was told the results will be up later today. I told him md response that labs are not all in yet.             Wants to speak with md [phone]

## 2011-04-28 ENCOUNTER — Institutional Professional Consult (permissible substitution): Payer: Medicare Other | Admitting: Emergency Medicine

## 2011-04-28 ENCOUNTER — Telehealth: Payer: Self-pay | Admitting: Internal Medicine

## 2011-04-28 ENCOUNTER — Other Ambulatory Visit: Payer: Self-pay | Admitting: Internal Medicine

## 2011-04-28 ENCOUNTER — Telehealth: Payer: Self-pay | Admitting: *Deleted

## 2011-04-28 DIAGNOSIS — Z711 Person with feared health complaint in whom no diagnosis is made: Secondary | ICD-10-CM

## 2011-04-28 DIAGNOSIS — R509 Fever, unspecified: Secondary | ICD-10-CM

## 2011-04-28 NOTE — Telephone Encounter (Signed)
Pt wife Harriett Sine called sounding very scarred and frustrated, tearful.  Pt fever was 103 last night, 38 days of ongoing fever, not eating, lost 15 lbs, still has the shivers, etc.  Dr Cyndra Numbers did a biopsy last Thurs, that was neg.  Dr Luciana Axe has done lots of labs, [place] etc, only thing CT shower was gall stones.  Wife called Dr Laveda Abbe office today requesting considering hospital admission, Dr Luciana Axe not in the office today (Friday), his direction was to keep return OV on Tuesday as scheduled.  If she wishes, take him to ER.  Wife wanted Dr Caryl Never to be informed of what was going on.  FYI

## 2011-04-28 NOTE — Telephone Encounter (Signed)
Spoke with patients wife regarding his condition and persistent fever.  Work up to date negative.  I have ordered a PET scan and bone marrow to do ASAP.

## 2011-04-28 NOTE — Telephone Encounter (Signed)
He called this am c/o non -stop fevers up to 103 not relieved by tylenol. Has no appetite & is losing weight. I paged & spoke with md. All tests are negative so far. He may set him up for a bone marrow biopsy. Will see him next week for his scheduled appt. I called back & let pt & wife know that md said he has not found cause of fevers yet. All tests were negative.  Wife was very upset & states "he has seen 7 doctors" stated she might have to take him to [facility] or other hospital. Wants medicine or to be admitted for "all these tests" I re-iterated that he should keep his appt & continue tylenol. If intolerable, go to ED over weekend. Wife wanted to know if they would do something or admit him. Told her I did not know how his problem would be handled. Discussed easy to eat or drink foods. States he will eat pudding cups, applesauce cups at times.

## 2011-05-01 ENCOUNTER — Other Ambulatory Visit: Payer: Self-pay | Admitting: Internal Medicine

## 2011-05-01 DIAGNOSIS — M159 Polyosteoarthritis, unspecified: Secondary | ICD-10-CM | POA: Insufficient documentation

## 2011-05-01 DIAGNOSIS — Z711 Person with feared health complaint in whom no diagnosis is made: Secondary | ICD-10-CM

## 2011-05-02 ENCOUNTER — Telehealth: Payer: Self-pay | Admitting: *Deleted

## 2011-05-02 ENCOUNTER — Ambulatory Visit: Payer: Medicare Other | Admitting: Internal Medicine

## 2011-05-02 LAB — CULTURE, BLOOD (SINGLE): Organism ID, Bacteria: NO GROWTH

## 2011-05-02 NOTE — Telephone Encounter (Signed)
Alisha from Radiology [phone]) called about his biopsy that was to be done today. Informed by wife that pt is in [facility] & will not make the md or radiology appt today. Wife also told Elease Hashimoto that she does not want to have him undergo another biopsy as he had one in 11/11

## 2011-05-08 ENCOUNTER — Inpatient Hospital Stay (HOSPITAL_COMMUNITY): Admission: RE | Admit: 2011-05-08 | Payer: Medicare Other | Source: Ambulatory Visit

## 2011-05-16 ENCOUNTER — Ambulatory Visit (HOSPITAL_COMMUNITY): Payer: Medicare Other

## 2011-05-16 ENCOUNTER — Encounter: Payer: Self-pay | Admitting: Thoracic Surgery

## 2011-05-16 ENCOUNTER — Ambulatory Visit (INDEPENDENT_AMBULATORY_CARE_PROVIDER_SITE_OTHER): Payer: Medicare Other | Admitting: Thoracic Surgery

## 2011-05-16 ENCOUNTER — Other Ambulatory Visit (HOSPITAL_COMMUNITY): Payer: Medicare Other

## 2011-05-16 VITALS — BP 118/77 | HR 106 | Temp 98.3°F | Resp 18 | Ht 70.0 in | Wt 174.0 lb

## 2011-05-16 DIAGNOSIS — R599 Enlarged lymph nodes, unspecified: Secondary | ICD-10-CM

## 2011-05-16 DIAGNOSIS — D761 Hemophagocytic lymphohistiocytosis: Secondary | ICD-10-CM

## 2011-05-16 DIAGNOSIS — R591 Generalized enlarged lymph nodes: Secondary | ICD-10-CM

## 2011-05-16 DIAGNOSIS — D763 Other histiocytosis syndromes: Secondary | ICD-10-CM

## 2011-05-16 DIAGNOSIS — R509 Fever, unspecified: Secondary | ICD-10-CM

## 2011-05-16 NOTE — Progress Notes (Signed)
HPI the patient returns today. I he was seen by Dr.:Comer. After skin., he developed worsening symptoms. He was not admitted to [facility] but went to [facility] where he was admitted. And after extensive workup treated with gamma globulin x2. He is on prednisone 20 mg a day and is restart his Enbrel. After treatment at [facility] his symptoms has resolved. He did tell me the diagnosis other than he was called [facility]. I am not sure of   the diagnosis and gland he is feeling better. His mediastinoscopy incisions were well healed. Current Outpatient Prescriptions  Medication Sig Dispense Refill  . etanercept (ENBREL) 50 MG/ML injection Inject 50 mg into the skin once a week.        . meloxicam (MOBIC) 15 MG tablet Take 15 mg by mouth daily.        . predniSONE (DELTASONE) 20 MG tablet Take 20 mg by mouth daily.           Review of Systems: Improved regarding his fever and chills   Physical Exam mediastinoscopy incisions were well healed   Diagnostic Tests: None   Impression: Status post mediastinoscopy and scalene node biopsy. Fever of unknown origin resolved   Plan: Return when necessary

## 2011-05-17 ENCOUNTER — Telehealth: Payer: Self-pay | Admitting: Family Medicine

## 2011-05-17 ENCOUNTER — Encounter: Payer: Self-pay | Admitting: Family Medicine

## 2011-05-17 ENCOUNTER — Ambulatory Visit (INDEPENDENT_AMBULATORY_CARE_PROVIDER_SITE_OTHER): Payer: Medicare Other | Admitting: Family Medicine

## 2011-05-17 ENCOUNTER — Encounter: Payer: Self-pay | Admitting: *Deleted

## 2011-05-17 VITALS — BP 110/72 | Temp 98.0°F | Wt 174.0 lb

## 2011-05-17 DIAGNOSIS — D763 Other histiocytosis syndromes: Secondary | ICD-10-CM

## 2011-05-17 DIAGNOSIS — D761 Hemophagocytic lymphohistiocytosis: Secondary | ICD-10-CM

## 2011-05-17 DIAGNOSIS — R509 Fever, unspecified: Secondary | ICD-10-CM

## 2011-05-17 LAB — FUNGUS CULTURE W SMEAR

## 2011-05-17 MED ORDER — PREDNISONE 10 MG PO TABS: ORAL_TABLET | ORAL | Status: DC

## 2011-05-17 NOTE — Telephone Encounter (Signed)
Saw Dr Caryl Never today. Was told to call back with the diagnosis for [facility].  DX: Hemophagacytic Histiolymphocytosis. Please advise patient.

## 2011-05-17 NOTE — Patient Instructions (Signed)
After completing prednisone 20 mg dose taper off by taking prednisone 10 mg daily for one week then taper to 5 mg daily for one week.

## 2011-05-17 NOTE — Progress Notes (Signed)
  Subjective:    Patient ID: Andrew Reid, male    DOB: August 31, 1945, 65 y.o.   MRN: [id number]  HPI  Medical followup. Patient had prolonged fever for several weeks. Initial working diagnosis was possible lymphoma based on abnormal CT scan. Lymph node biopsy did not reveal any lymphoma. He was eventually hospitalized [facility] 05/01/2011 through 23rd of November. He was apparently diagnosed with rare blood disease by hematologist. He did not have name at this time. He was given prednisone and immunoglobulin and defervesced within hours. Followup with hematologist there in approximately 2 months. He was discharged on prednisone 20 mg daily which he was prescribed for duration of over one month but not told to taper off this. Has seen rheumatologist and resumed Enbrel injections.  Patient had extensive workup prior to [facility]. In addition to multiple labs he had bone marrow biopsy and transesophageal echo which did not show an etiology. No recurrent fever. Has had flu vaccine and Pneumovax.  Feels much better at this time.  Past Medical History  Diagnosis Date  . HERPES ZOSTER 12/15/2008  . Dysuria 12/10/2008  . Psoriatic arthritis   . Unspecified hemorrhoids without mention of complication   . Pain in limb   . Fever, unspecified 03/23/11  . Arthritis    Past Surgical History  Procedure Date  . No past surgeries lymph biopsy  . Video bronchoscopy 04/20/2011    Procedure: VIDEO BRONCHOSCOPY;  Surgeon: Norton Blizzard, MD;  Location: [facility] OR;  Service: Thoracic;  Laterality: N/A;  . Video mediastinoscopy 04/20/2011    Procedure: VIDEO MEDIASTINOSCOPY;  Surgeon: Norton Blizzard, MD;  Location: [facility] OR;  Service: Thoracic;  Laterality: N/A;    reports that he has never smoked. He has never used smokeless tobacco. He reports that he does not drink alcohol or use illicit drugs. family history includes Breast cancer in his mother and Cancer in his father.  There is no history of  Arthritis. Allergies  Allergen Reactions  . Augmentin Rash      Review of Systems  Constitutional: Negative for fever, chills, appetite change, fatigue and unexpected weight change.  Respiratory: Negative for cough and shortness of breath.   Cardiovascular: Negative for chest pain, palpitations and leg swelling.  Gastrointestinal: Negative for abdominal pain.  Genitourinary: Negative for dysuria.  Neurological: Negative for headaches.       Objective:   Physical Exam  Constitutional: He appears well-developed and well-nourished.  HENT:  Right Ear: External ear normal.  Left Ear: External ear normal.  Mouth/Throat: Oropharynx is clear and moist.  Neck: Neck supple. No thyromegaly present.  Cardiovascular: Normal rate, regular rhythm and normal heart sounds.   No murmur heard. Pulmonary/Chest: Effort normal and breath sounds normal. No respiratory distress. He has no wheezes. He has no rales.  Musculoskeletal: He exhibits no edema.  Lymphadenopathy:    He has no cervical adenopathy.          Assessment & Plan:  Prolonged fever. Patient is instructed to call back with diagnosis so we can included in records. We have concerns about him stopping prednisone abruptly after taking 20 mg daily for over one month. We have given him taper regimen after he completes full course that he was given.  HEMOPHAGACYTIC  HISTIOLYMPHOCYTOSIS

## 2011-05-22 ENCOUNTER — Encounter: Payer: Self-pay | Admitting: Internal Medicine

## 2011-06-02 LAB — AFB CULTURE WITH SMEAR (NOT AT ARMC): Acid Fast Smear: NONE SEEN

## 2011-06-21 DIAGNOSIS — L4050 Arthropathic psoriasis, unspecified: Secondary | ICD-10-CM | POA: Diagnosis not present

## 2011-06-22 DIAGNOSIS — L4050 Arthropathic psoriasis, unspecified: Secondary | ICD-10-CM | POA: Diagnosis not present

## 2011-06-30 ENCOUNTER — Telehealth: Payer: Self-pay | Admitting: *Deleted

## 2011-06-30 NOTE — Telephone Encounter (Signed)
[facility] faxed records to Dr. Merlyn Albert Ferrufino's office.

## 2011-07-03 ENCOUNTER — Telehealth: Payer: Self-pay | Admitting: *Deleted

## 2011-07-03 NOTE — Telephone Encounter (Signed)
rec'd a call from Angie at [facility] asking for an order for PT?OT?ST. She is not sure which one. I do not see any order for this in our system. I called his primary & they have only referred him to a pulmonologist. I called Dr. Luciana Axe. He did not refer him. I left a message for Angie to call me back. We did not make a referral for him & he is no longer getting care from [place]

## 2011-07-06 DIAGNOSIS — C969 Malignant neoplasm of lymphoid, hematopoietic and related tissue, unspecified: Secondary | ICD-10-CM | POA: Diagnosis not present

## 2011-07-06 DIAGNOSIS — D763 Other histiocytosis syndromes: Secondary | ICD-10-CM | POA: Diagnosis not present

## 2011-07-06 DIAGNOSIS — M159 Polyosteoarthritis, unspecified: Secondary | ICD-10-CM | POA: Diagnosis not present

## 2011-07-07 DIAGNOSIS — L4050 Arthropathic psoriasis, unspecified: Secondary | ICD-10-CM | POA: Diagnosis not present

## 2011-08-03 DIAGNOSIS — L4050 Arthropathic psoriasis, unspecified: Secondary | ICD-10-CM | POA: Diagnosis not present

## 2011-08-17 DIAGNOSIS — L4050 Arthropathic psoriasis, unspecified: Secondary | ICD-10-CM | POA: Diagnosis not present

## 2011-09-07 DIAGNOSIS — L4050 Arthropathic psoriasis, unspecified: Secondary | ICD-10-CM | POA: Diagnosis not present

## 2011-09-21 DIAGNOSIS — L4050 Arthropathic psoriasis, unspecified: Secondary | ICD-10-CM | POA: Diagnosis not present

## 2011-10-05 DIAGNOSIS — L4050 Arthropathic psoriasis, unspecified: Secondary | ICD-10-CM | POA: Diagnosis not present

## 2011-10-05 DIAGNOSIS — Z79899 Other long term (current) drug therapy: Secondary | ICD-10-CM | POA: Diagnosis not present

## 2011-10-05 DIAGNOSIS — D763 Other histiocytosis syndromes: Secondary | ICD-10-CM | POA: Diagnosis not present

## 2011-10-09 DIAGNOSIS — L4050 Arthropathic psoriasis, unspecified: Secondary | ICD-10-CM | POA: Diagnosis not present

## 2011-11-09 DIAGNOSIS — L4050 Arthropathic psoriasis, unspecified: Secondary | ICD-10-CM | POA: Diagnosis not present

## 2011-11-23 DIAGNOSIS — L4050 Arthropathic psoriasis, unspecified: Secondary | ICD-10-CM | POA: Diagnosis not present

## 2011-12-07 DIAGNOSIS — L4050 Arthropathic psoriasis, unspecified: Secondary | ICD-10-CM | POA: Diagnosis not present

## 2012-01-04 DIAGNOSIS — L4050 Arthropathic psoriasis, unspecified: Secondary | ICD-10-CM | POA: Diagnosis not present

## 2012-01-25 DIAGNOSIS — L4050 Arthropathic psoriasis, unspecified: Secondary | ICD-10-CM | POA: Diagnosis not present

## 2012-01-31 IMAGING — CT CT CHEST W/ CM
2 of 3 series · 15 of 36 positions shown, 18 images · IV contrast (Omnipaque 300)
Comparison: 03/30/2011; report from 10/25/1999

CLINICAL DATA: Fever.  Muscle soreness.  Abnormal chest radiograph
showing possible paratracheal adenopathy.

CT CHEST WITH CONTRAST
TECHNIQUE: Multidetector CT imaging of the chest was performed
following the standard protocol during bolus administration of
intravenous contrast.
Contrast: 80mL OMNIPAQUE IOHEXOL 300 MG/ML IV SOLN

[Series 2: chest routine with · axial · 0.77mm/px · z∈[-326,-50]mm · 12 of 65 slices shown, 15 images]
[im 5/65  mediastinal]
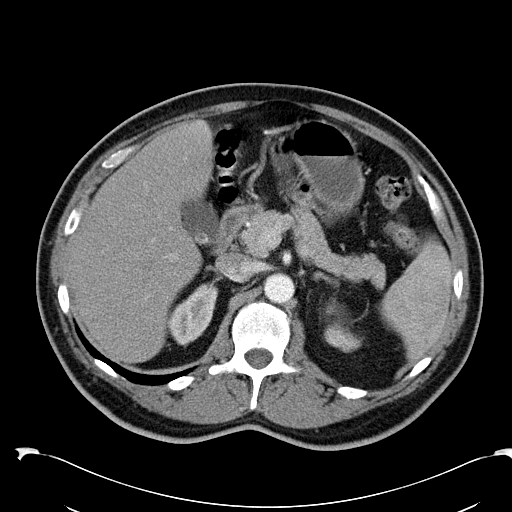
[im 5/65  lung]
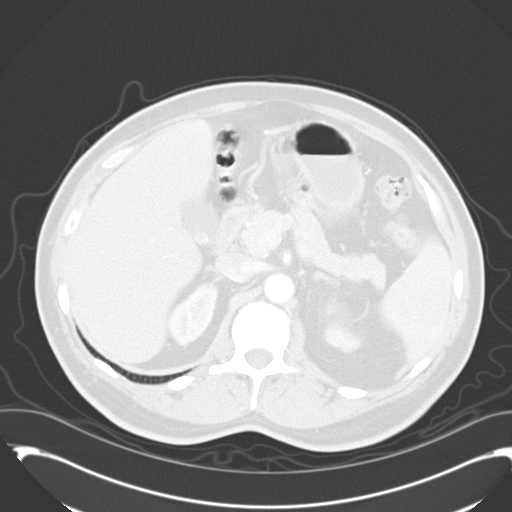
[im 10/65  lung]
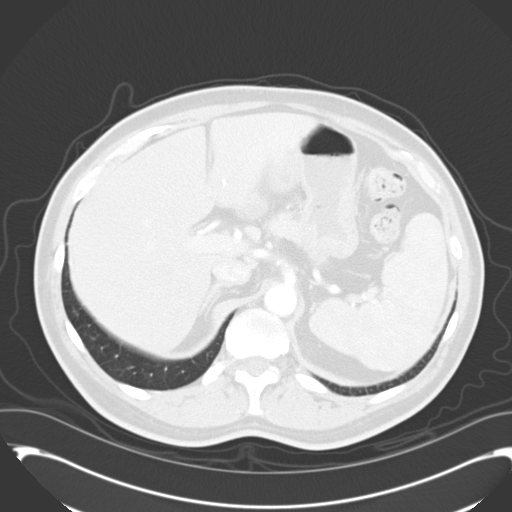
[im 15/65  lung]
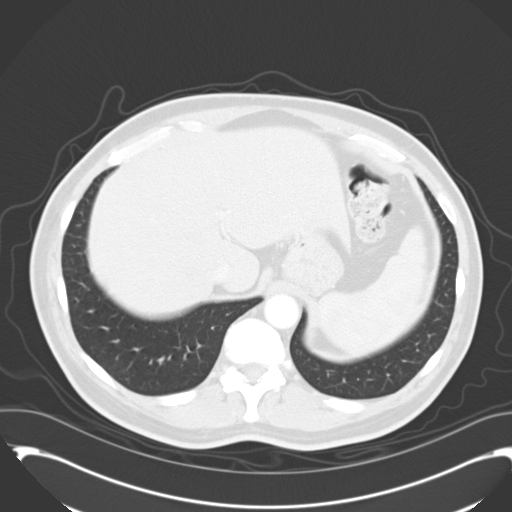
[im 19/65  lung]
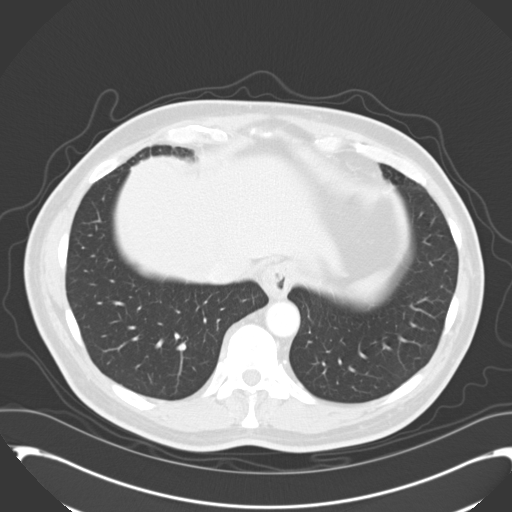
[im 24/65  mediastinal]
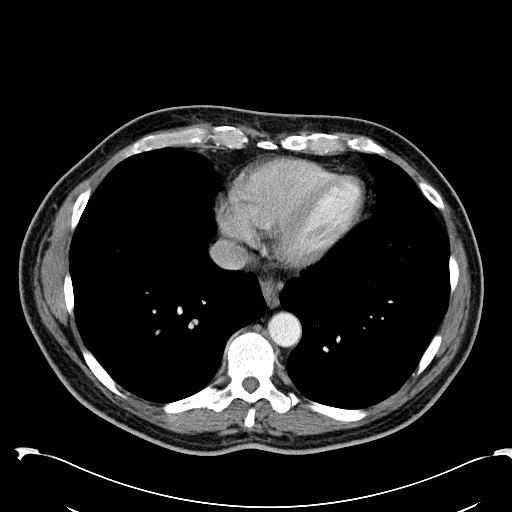
[im 24/65  lung]
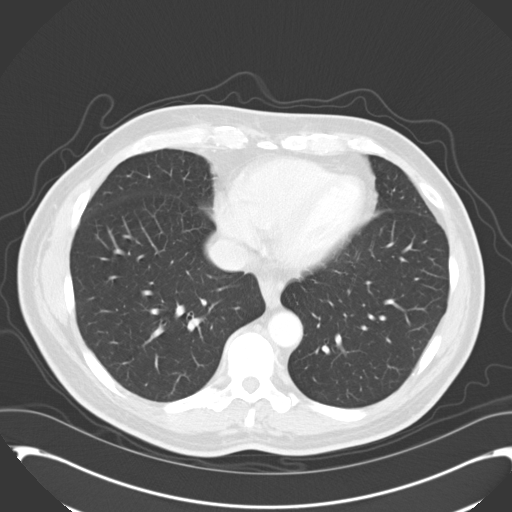
[im 29/65  lung]
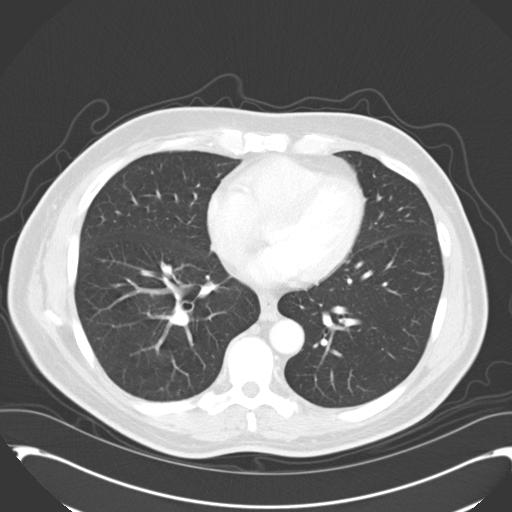
[im 36/65  lung]
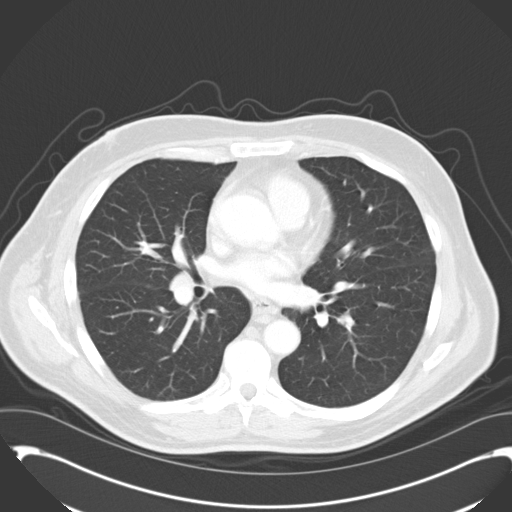
[im 41/65  lung]
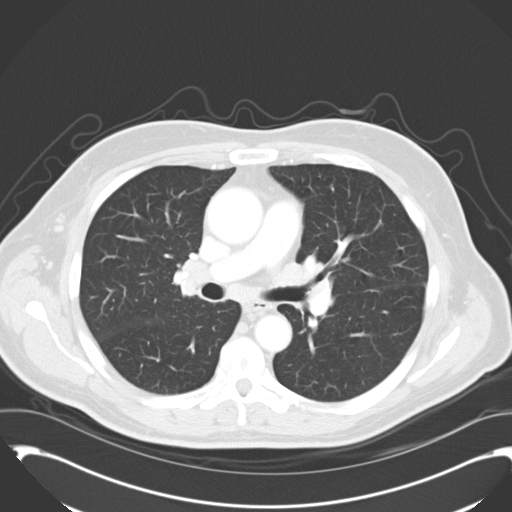
[im 46/65  mediastinal]
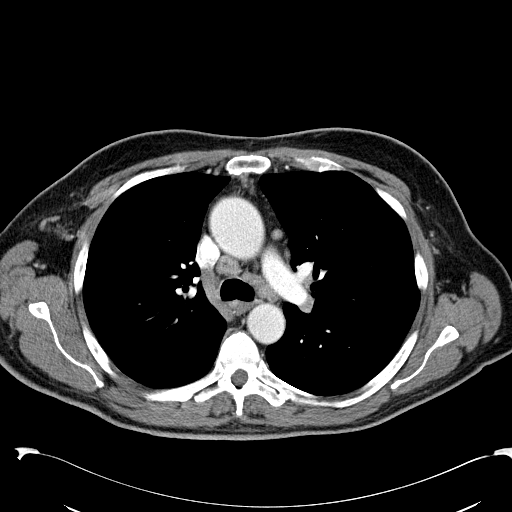
[im 46/65  lung]
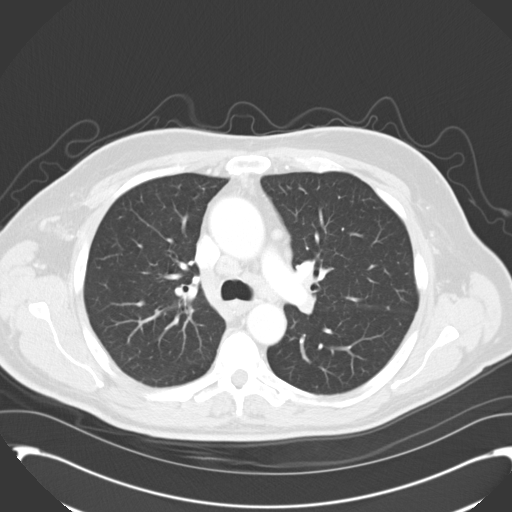
[im 50/65  lung]
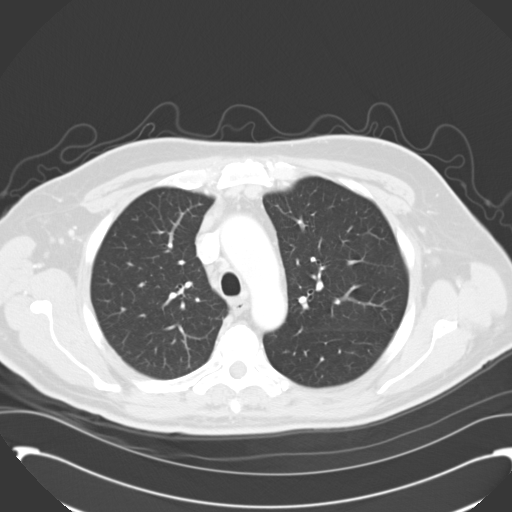
[im 55/65  lung]
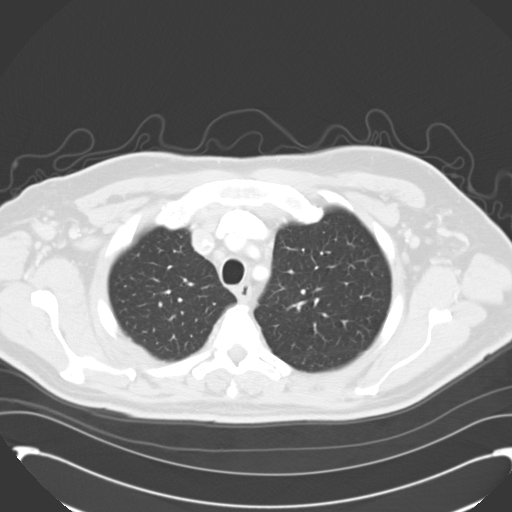
[im 60/65  lung]
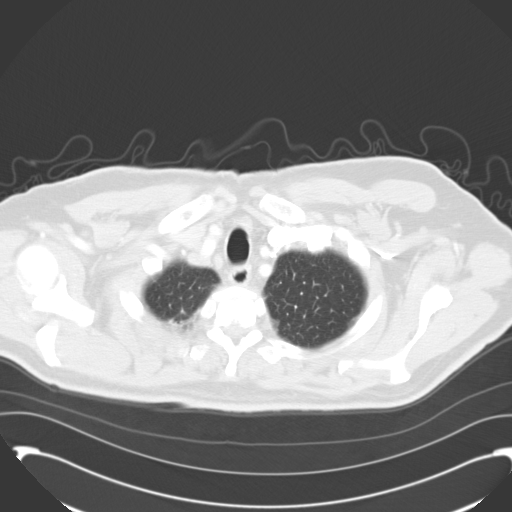

[Series 602: cor · coronal · 0.77mm/px · 3 of 110 slices shown]
[im 22/110  lung]
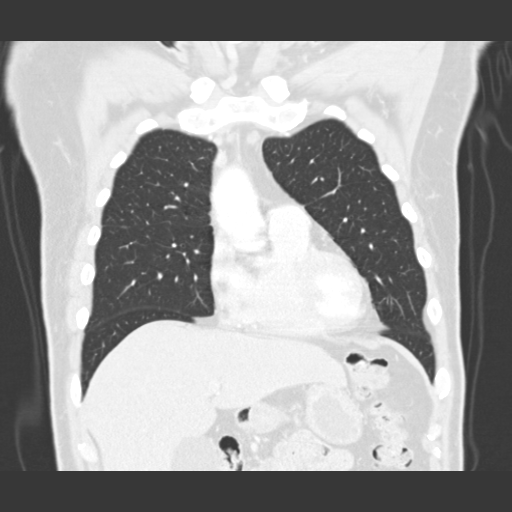
[im 44/110  lung]
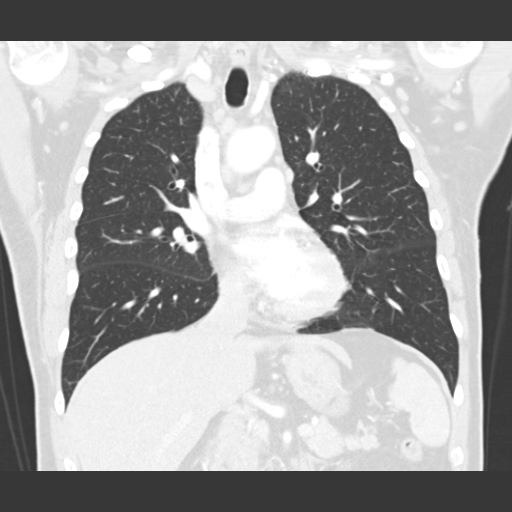
[im 66/110  lung]
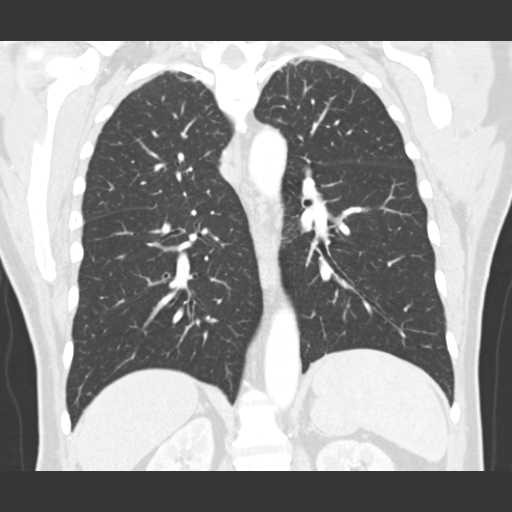

[15 of 36 positions shown; findings below may reference images not displayed]

FINDINGS: Scattered small axillary lymph nodes are present.
Anterior mediastinal nodes measure up to 10 mm in thickness as do
the prevascular lymph nodes.

A node anterior to the carina has a short axis dimension of 1.5 cm.
Additional smaller paratracheal nodes are present.

An AP window node has a short axis diameter of 1.0 cm.  The right
hilar node has a short axis diameter 1.3 cm and a left hilar node
has a short axis diameter 1.5 cm.  Multiple additional hilar nodes
are present.

A small hiatal hernia is present.  The ascending thoracic aorta
measures 4.2 cm in diameter, compatible with a small aneurysm.
Heart size is within normal limits.

A paraesophageal lymph node measures 11 mm in short axis on image
40 of series 2.

Multiple gallstones are present in the gallbladder measuring up to
1.2 cm in diameter.

Mild biapical pleuroparenchymal scarring noted.  The lungs appear
otherwise clear.
IMPRESSION: 1.  Pathologic mediastinal and hilar adenopathy.  Although similar
findings were noted in the report from 1889, the nodes on today's
exam sound like they are larger than those noted on the prior
report.  Chronic inflammatory nodes or low grade malignancy such as
lymphoma could have such an appearance.  We do not demonstrate
characteristic parenchymal changes in the lungs of sarcoidosis.
Bronchoscopy with node sampling may be warranted.
2.  Mild ascending aortic aneurysm.
3.  Cholelithiasis.
4.  Small hiatal hernia.

## 2012-02-01 DIAGNOSIS — L4050 Arthropathic psoriasis, unspecified: Secondary | ICD-10-CM | POA: Diagnosis not present

## 2012-02-14 IMAGING — CR DG CHEST 2V
2 series · 2 of 2 positions shown · non-contrast
Comparison: [place] CT chest dated 04/06/2011

CLINICAL DATA: Mediastinal adenopathy, preop

CHEST - 2 VIEW

[view not recorded (1 of 2)]
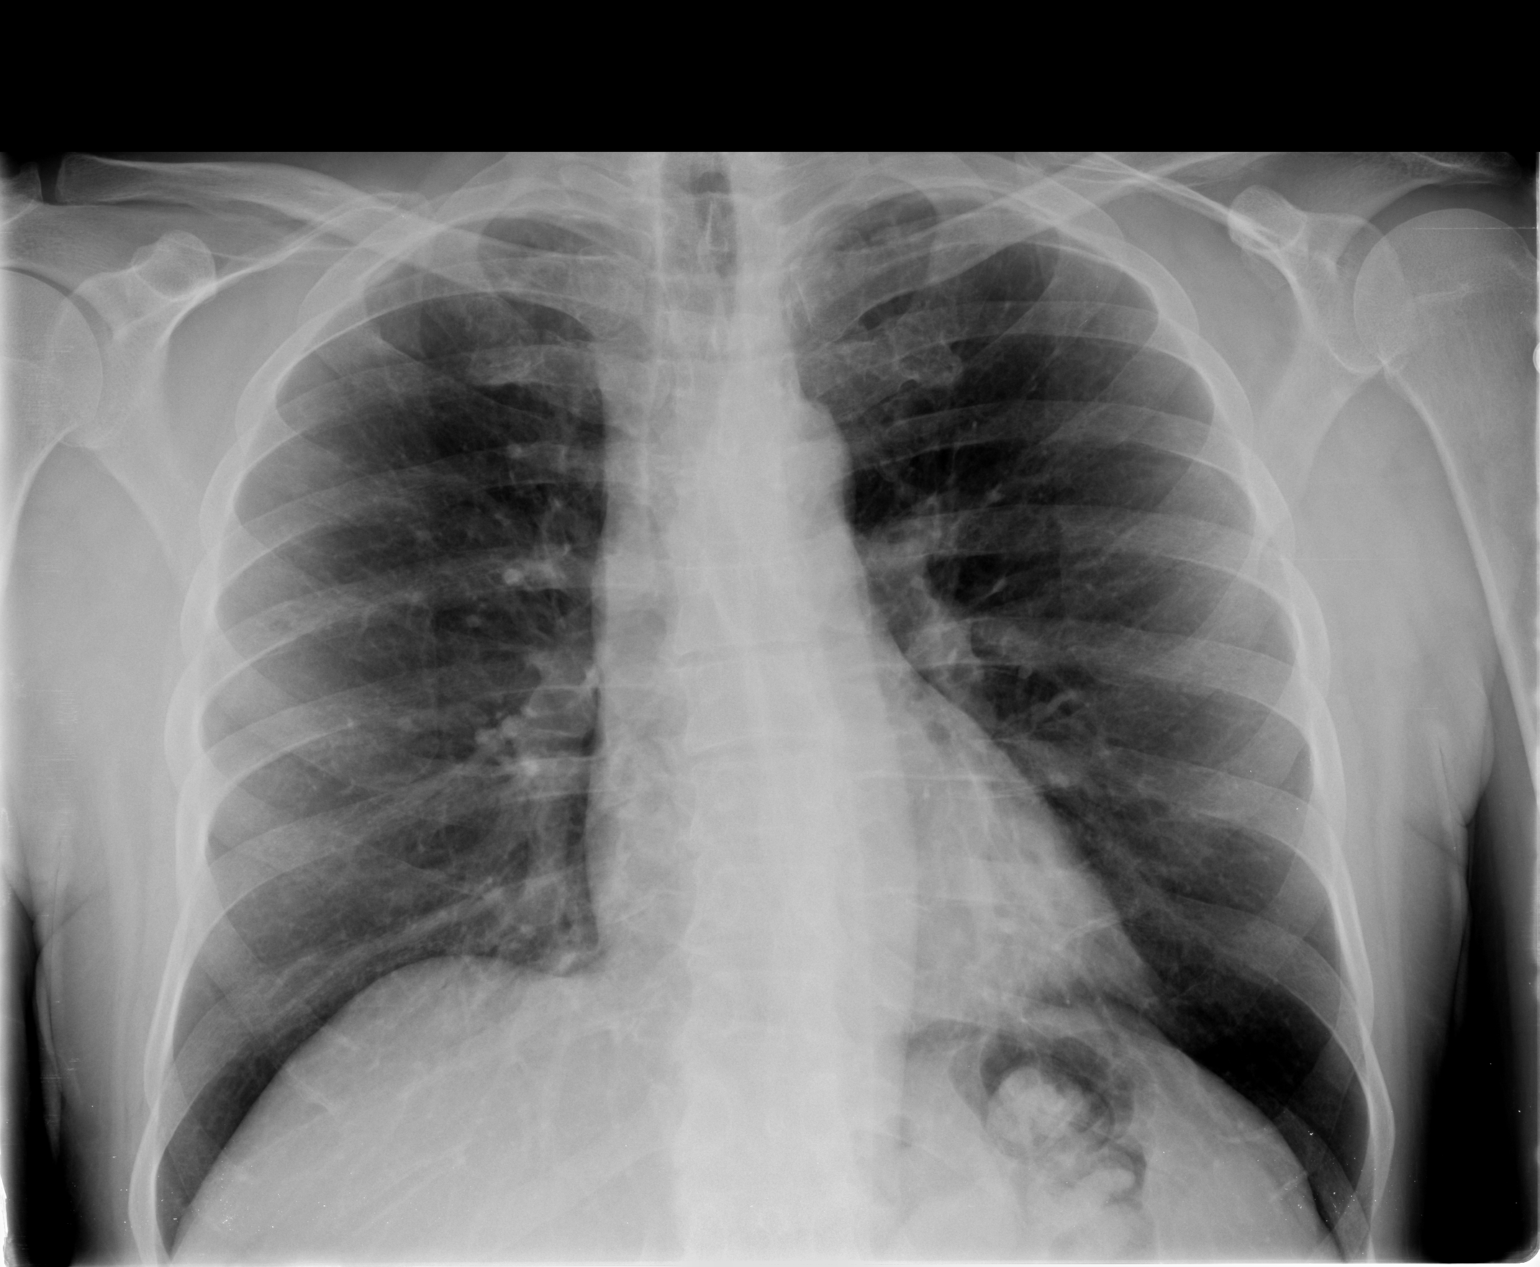

[view not recorded (2 of 2)]
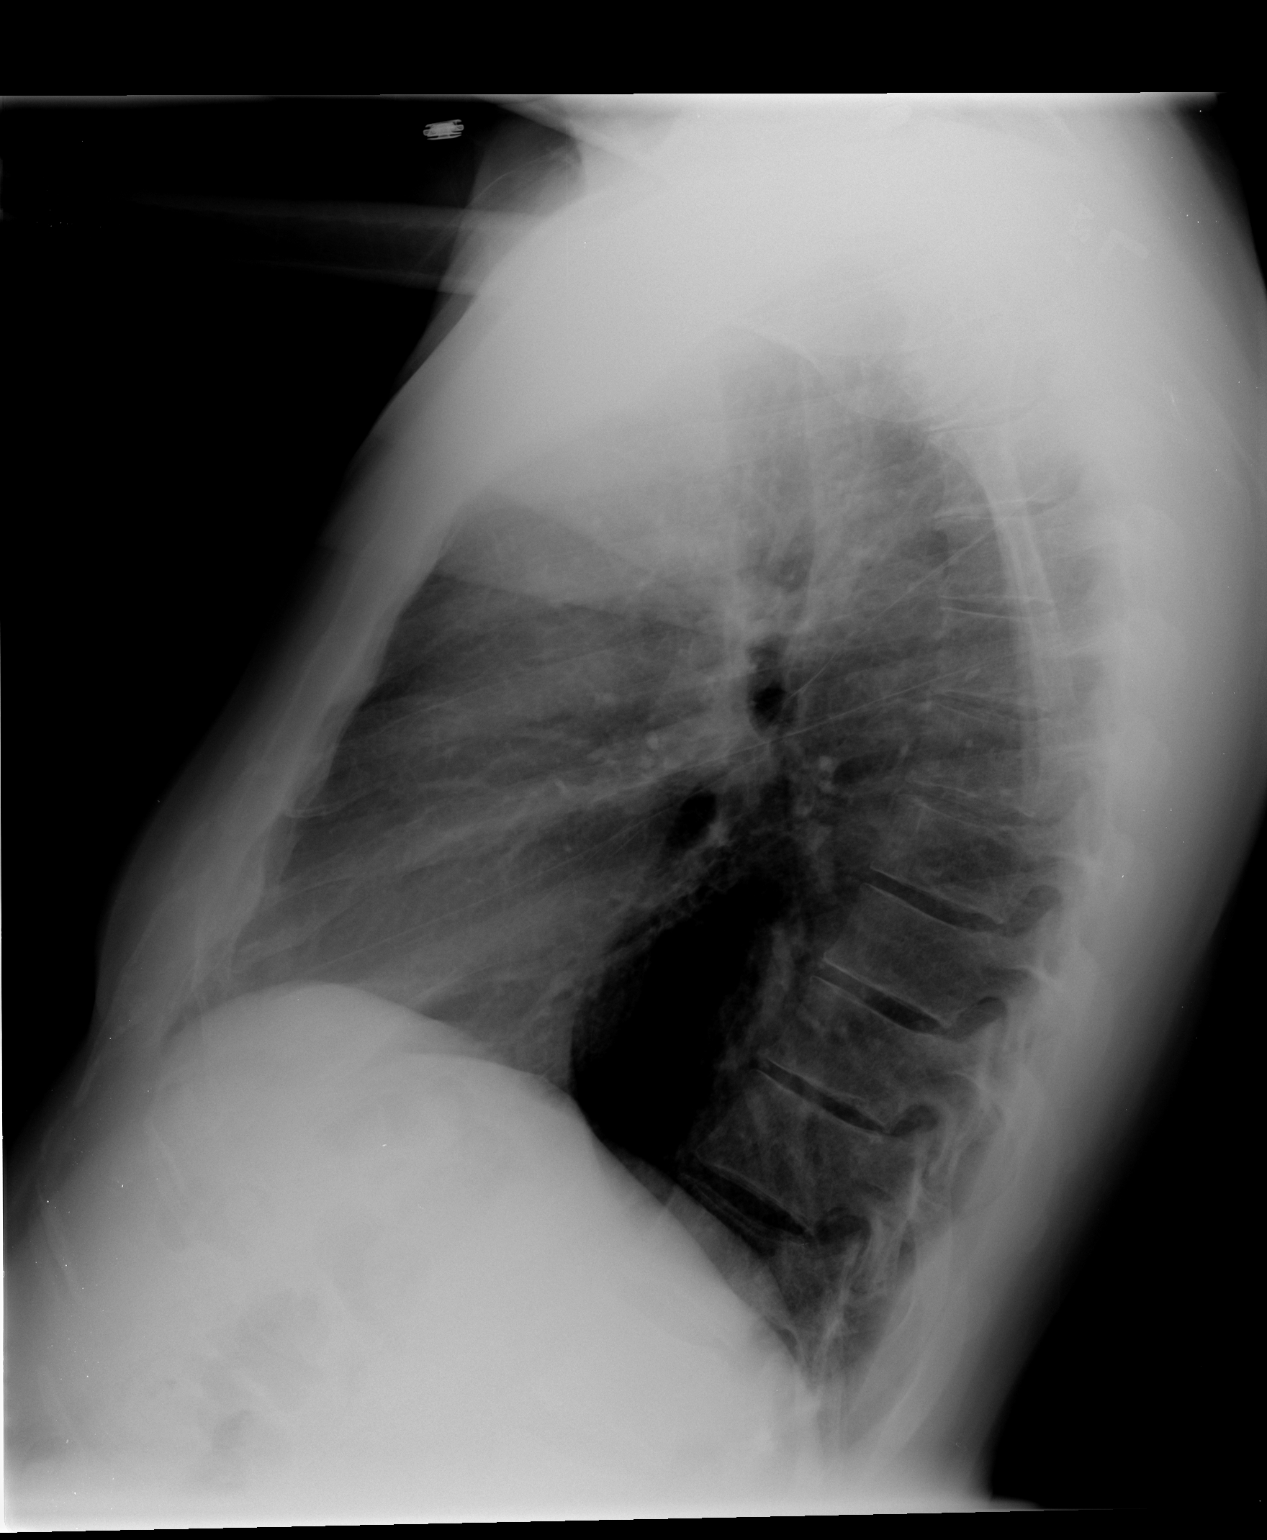

[2 of 2 positions shown; findings below may reference images not displayed]

FINDINGS: Lungs are essentially clear. No pleural effusion or
pneumothorax.

Known mediastinal lymphadenopathy is better visualized on CT.

The heart is normal in size.

Visualized osseous structures are within normal limits.
IMPRESSION: No evidence of acute cardiopulmonary disease.

Known mediastinal lymphadenopathy is better visualized on CT.

## 2012-02-21 IMAGING — US US ABDOMEN COMPLETE
1 series · 14 of 25 positions shown · non-contrast
Comparison: CT chest 04/06/2011

CLINICAL DATA: Fever of unknown origin.  Question hepatitis.

ABDOMINAL ULTRASOUND COMPLETE

[Series 1: us abdomen complete · 0.26mm/px · 14 of 80 slices shown]
[im 1/80]
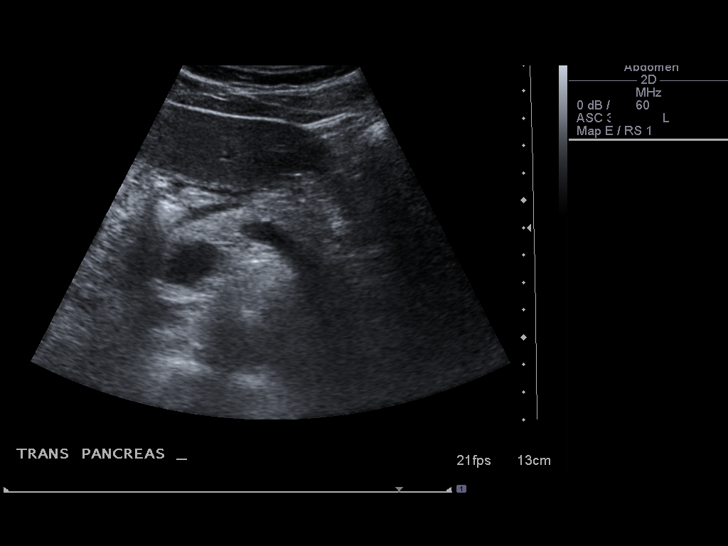
[im 7/80]
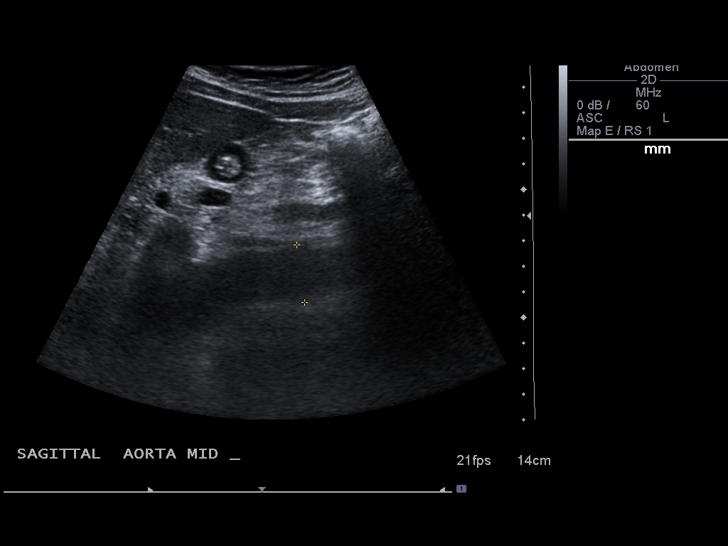
[im 14/80]
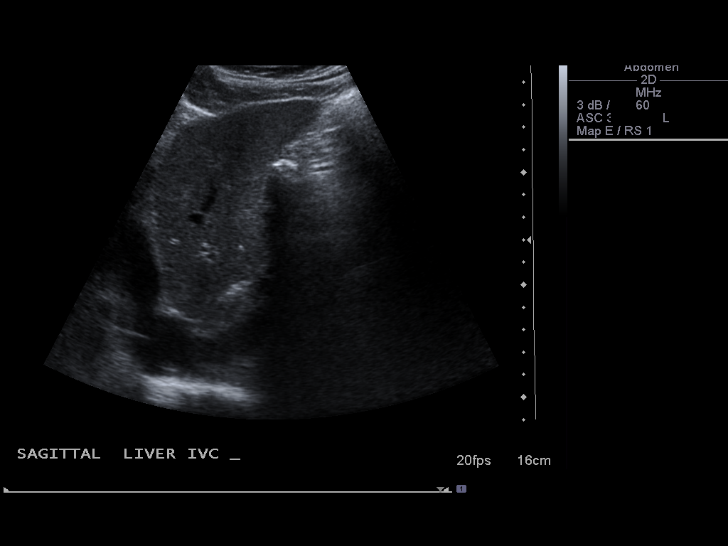
[im 20/80]
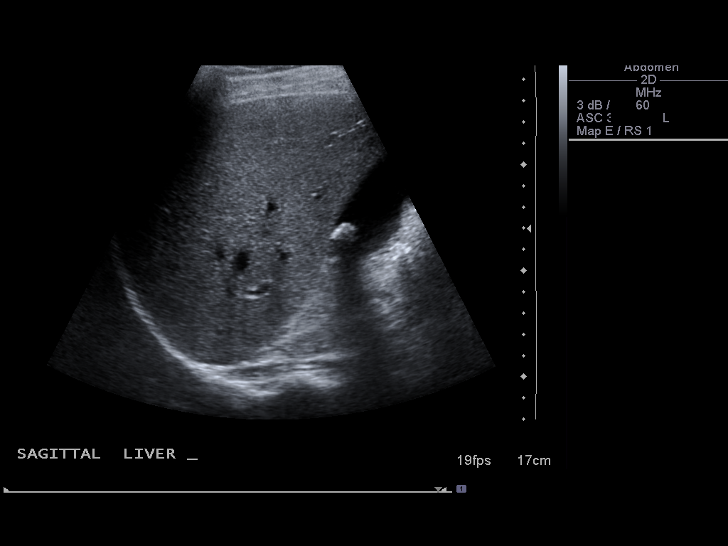
[im 27/80]
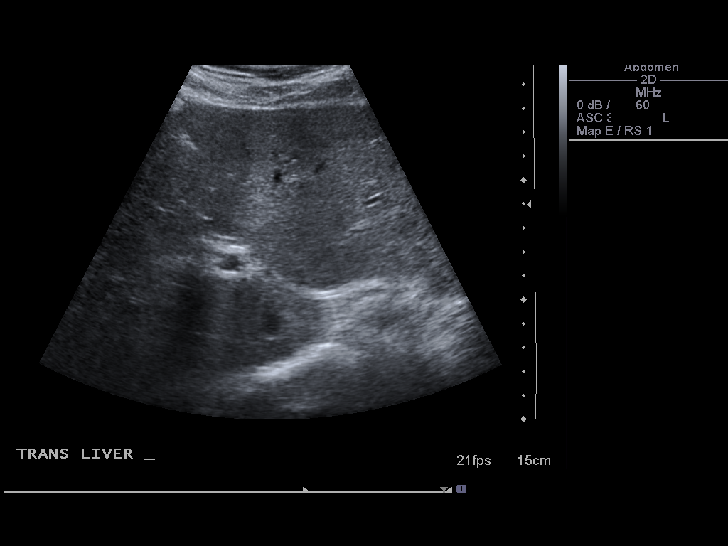
[im 30/80]
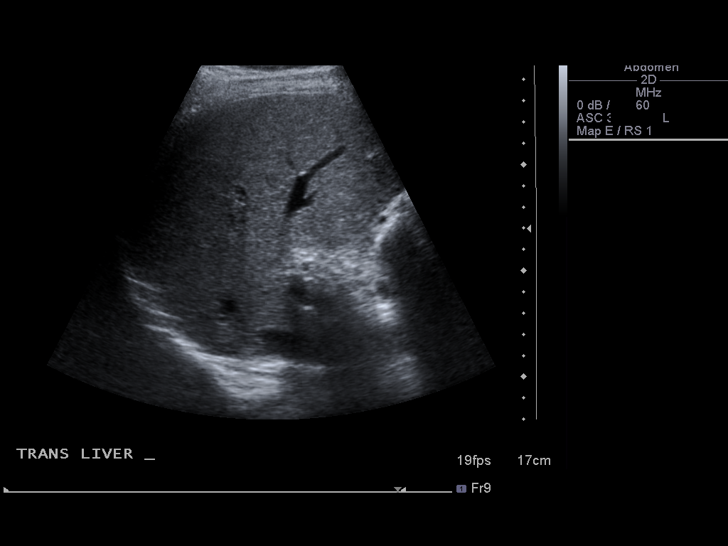
[im 37/80]
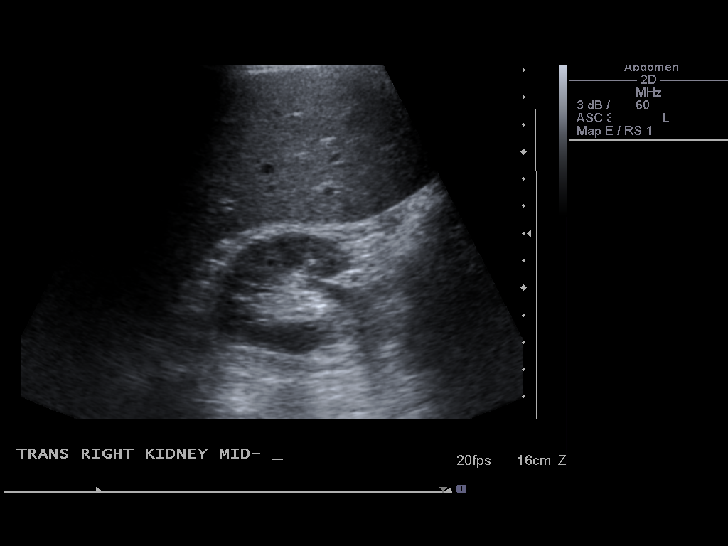
[im 43/80]
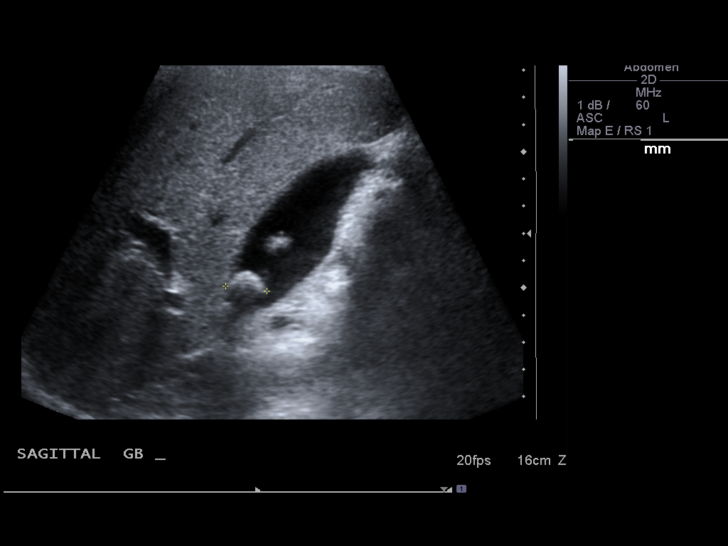
[im 50/80]
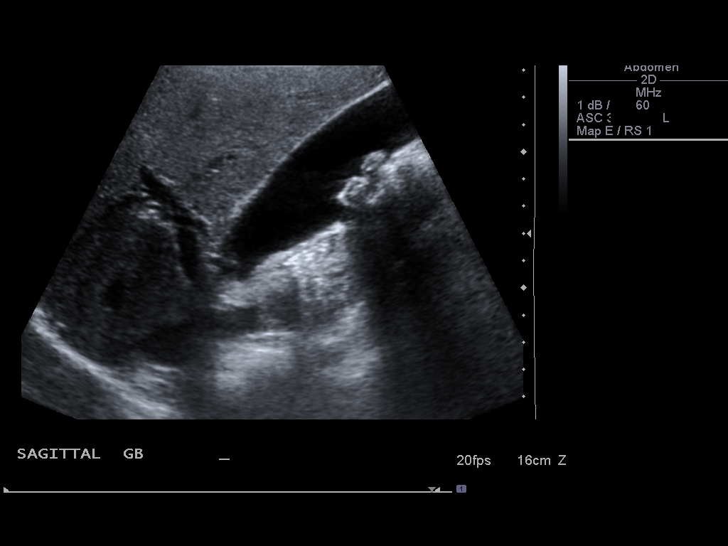
[im 53/80]
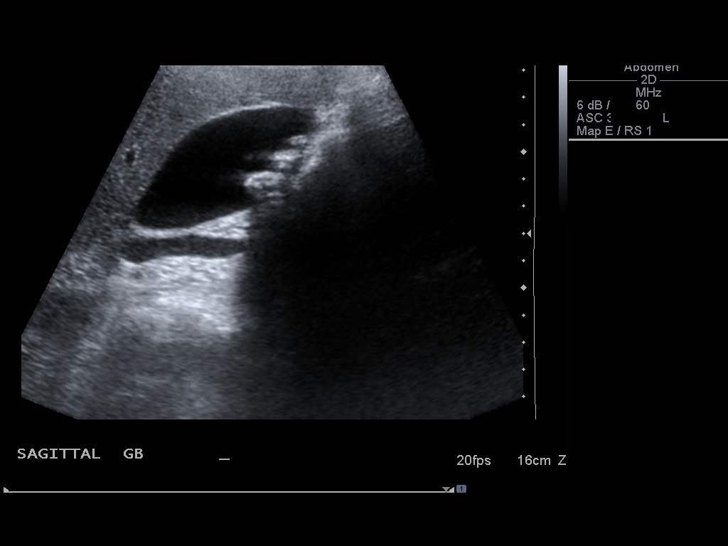
[im 60/80]
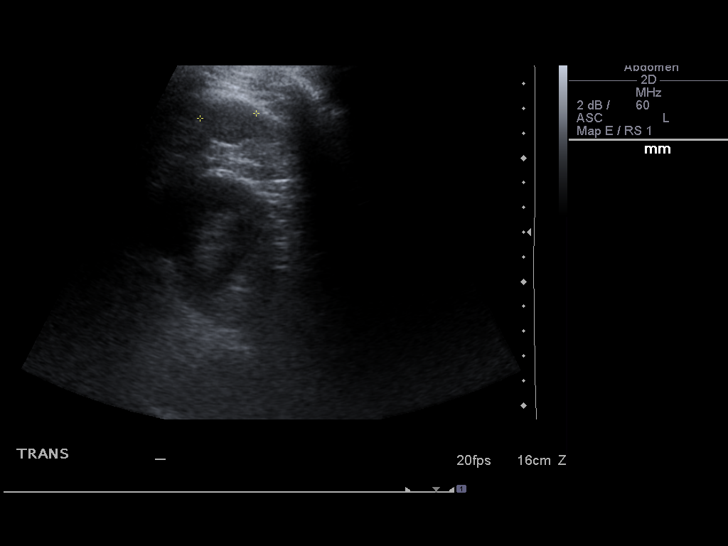
[im 66/80]
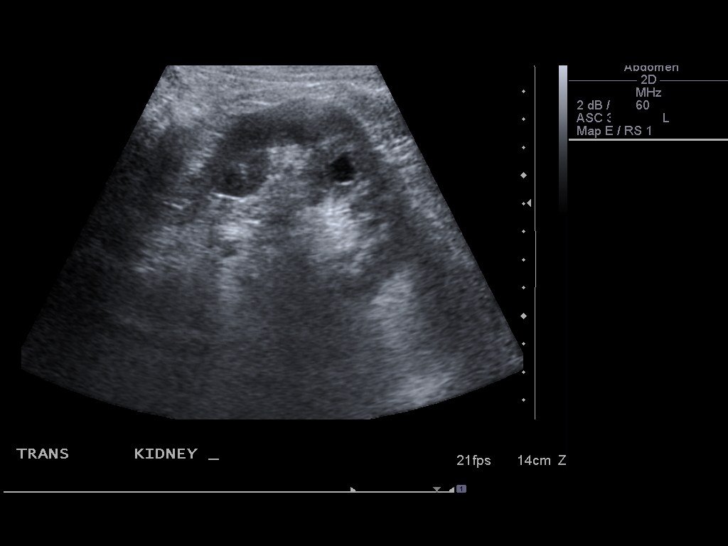
[im 73/80]
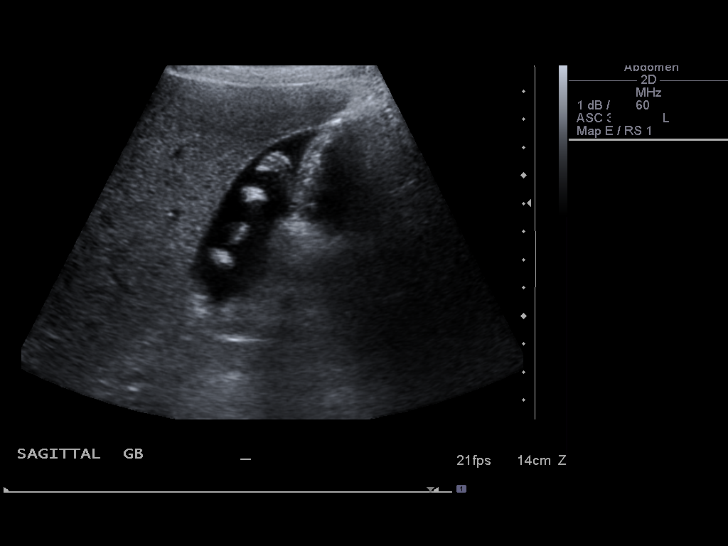
[im 80/80]
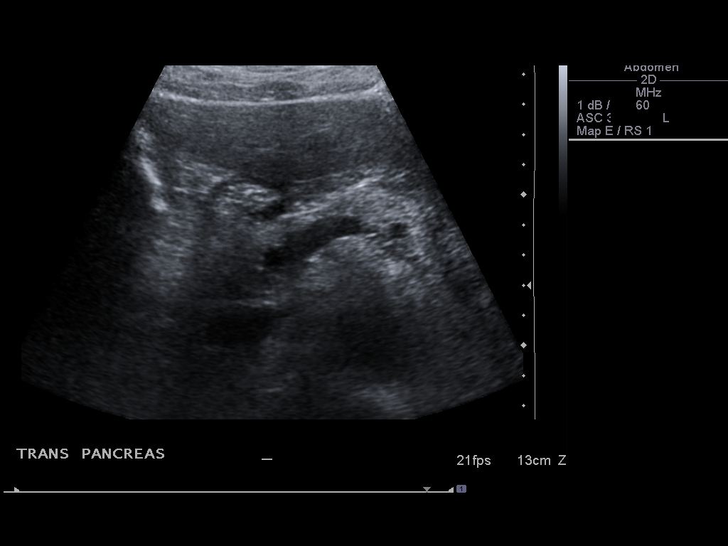

[14 of 25 positions shown; findings below may reference images not displayed]

FINDINGS: Gallbladder:  There are multiple mobile gallstones.  The largest
stone measures approximately 1.5 cm.  Gallbladder wall thickness is
normal (2.3 mm).  The sonographic Murphy's sign is negative.

Common Bile Duct:  Within normal limits in caliber. Measures
mm.

Liver: No focal mass lesion identified.  Within normal limits in
parenchymal echogenicity.

IVC:  Appears normal.

Pancreas:  No abnormality identified.

Spleen:  Within normal limits in size and echotexture.

Right kidney:  Normal in size and parenchymal echogenicity.  No
evidence of mass or hydronephrosis.

Left kidney:  Normal in size and parenchymal echogenicity.  In the
mid pole is a cortical simple cyst measuring 1.0 x 0.9 x 0.9 cm.
No solid mass is identified.  Negative for hydronephrosis.

Abdominal Aorta:  No aneurysm identified.

No ascites is identified.
IMPRESSION: 1.  Cholelithiasis.
2.  Otherwise negative exam.

## 2012-03-14 DIAGNOSIS — L4050 Arthropathic psoriasis, unspecified: Secondary | ICD-10-CM | POA: Diagnosis not present

## 2012-03-19 DIAGNOSIS — L4050 Arthropathic psoriasis, unspecified: Secondary | ICD-10-CM | POA: Diagnosis not present

## 2012-03-19 DIAGNOSIS — Z23 Encounter for immunization: Secondary | ICD-10-CM | POA: Diagnosis not present

## 2012-03-28 ENCOUNTER — Other Ambulatory Visit (INDEPENDENT_AMBULATORY_CARE_PROVIDER_SITE_OTHER): Payer: PRIVATE HEALTH INSURANCE

## 2012-03-28 DIAGNOSIS — Z0000 Encounter for general adult medical examination without abnormal findings: Secondary | ICD-10-CM

## 2012-03-28 DIAGNOSIS — Z136 Encounter for screening for cardiovascular disorders: Secondary | ICD-10-CM

## 2012-03-28 DIAGNOSIS — Z125 Encounter for screening for malignant neoplasm of prostate: Secondary | ICD-10-CM

## 2012-03-28 DIAGNOSIS — D763 Other histiocytosis syndromes: Secondary | ICD-10-CM

## 2012-03-28 LAB — CBC WITH DIFFERENTIAL/PLATELET
Eosinophils Relative: 2 % (ref 0.0–5.0)
HCT: 43.8 % (ref 39.0–52.0)
Hemoglobin: 14.9 g/dL (ref 13.0–17.0)
Lymphs Abs: 1.5 10*3/uL (ref 0.7–4.0)
Monocytes Relative: 14.9 % — ABNORMAL HIGH (ref 3.0–12.0)
Neutro Abs: 2.7 10*3/uL (ref 1.4–7.7)
RDW: 15.4 % — ABNORMAL HIGH (ref 11.5–14.6)
WBC: 5.1 10*3/uL (ref 4.5–10.5)

## 2012-03-28 LAB — TSH: TSH: 2.19 u[IU]/mL (ref 0.35–5.50)

## 2012-03-28 LAB — POCT URINALYSIS DIPSTICK
Protein, UA: NEGATIVE
Spec Grav, UA: 1.02
Urobilinogen, UA: 0.2

## 2012-03-28 LAB — BASIC METABOLIC PANEL
GFR: 75.82 mL/min (ref >60.00)
Glucose, Bld: 81 mg/dL (ref 70–99)
Potassium: 4.9 mEq/L (ref 3.5–5.1)
Sodium: 138 mEq/L (ref 135–145)

## 2012-03-28 LAB — HEPATIC FUNCTION PANEL
ALT: 23 U/L (ref 0–53)
AST: 25 U/L (ref 0–37)
Albumin: 3.9 g/dL (ref 3.5–5.2)
Total Protein: 7.2 g/dL (ref 6.0–8.3)

## 2012-03-28 LAB — LIPID PANEL
LDL Cholesterol: 132 mg/dL — ABNORMAL HIGH (ref 0–99)
Total CHOL/HDL Ratio: 4

## 2012-04-04 ENCOUNTER — Ambulatory Visit (INDEPENDENT_AMBULATORY_CARE_PROVIDER_SITE_OTHER): Payer: Medicare Other | Admitting: Family Medicine

## 2012-04-04 ENCOUNTER — Encounter: Payer: Self-pay | Admitting: Family Medicine

## 2012-04-04 VITALS — BP 118/74 | Temp 98.6°F | Ht 69.0 in | Wt 201.0 lb

## 2012-04-04 DIAGNOSIS — Z23 Encounter for immunization: Secondary | ICD-10-CM

## 2012-04-04 DIAGNOSIS — Z0000 Encounter for general adult medical examination without abnormal findings: Secondary | ICD-10-CM

## 2012-04-04 DIAGNOSIS — Z792 Long term (current) use of antibiotics: Secondary | ICD-10-CM

## 2012-04-04 DIAGNOSIS — I712 Thoracic aortic aneurysm, without rupture, unspecified: Secondary | ICD-10-CM | POA: Insufficient documentation

## 2012-04-04 DIAGNOSIS — R319 Hematuria, unspecified: Secondary | ICD-10-CM | POA: Diagnosis not present

## 2012-04-04 DIAGNOSIS — IMO0001 Reserved for inherently not codable concepts without codable children: Secondary | ICD-10-CM

## 2012-04-04 LAB — URINALYSIS, ROUTINE W REFLEX MICROSCOPIC
Ketones, ur: NEGATIVE
Specific Gravity, Urine: 1.01 (ref 1.000–1.030)
Total Protein, Urine: NEGATIVE
Urine Glucose: NEGATIVE
Urobilinogen, UA: 0.2 (ref 0.0–1.0)
pH: 6 (ref 5.0–8.0)

## 2012-04-04 LAB — POCT URINALYSIS DIPSTICK
Bilirubin, UA: NEGATIVE
Glucose, UA: NEGATIVE
Ketones, UA: NEGATIVE
Nitrite, UA: NEGATIVE
Spec Grav, UA: 1.01
pH, UA: 6

## 2012-04-04 NOTE — Patient Instructions (Addendum)
Consider repeat yearly physical and follow up sooner as needed.

## 2012-04-04 NOTE — Progress Notes (Signed)
Subjective:    Patient ID: Andrew Reid, male    DOB: March 08, 1946, 66 y.o.   MRN: [id number]  HPI  Consider complete physical. 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 Improved following immunoglobulin therapy. He has history of psoriatic arthritis followed by a local rheumatologist. Meds are Remicade and methotrexate and takes folic acid. Had flu vaccine. Last Pneumovax little over 5 years ago before 65. Colonoscopy 2007. Generally feels well. Nonsmoker.  1.  Risk factors based on Past Medical , Social, and Family history reviewed as below. 2.  Limitations in physical activities none. 3.  Depression/mood mood stable.  No depression or anxiety issues. 4.  Hearing no deficits 5.  ADLs independent in all 6.  Cognitive function (orientation to time and place, language, writing, speech,memory) no memory deficits.  Language and judgement intact. 7.  Home Safety no issues 8.  Height, weight, and visual acuity. All stable.   9.  Counseling regular exercise recommended 10. Recommendation of preventive services. Pneumovax.  Hemoccults given.   Past Medical History  Diagnosis Date  . HERPES ZOSTER 12/15/2008  . Dysuria 12/10/2008  . Psoriatic arthritis   . Unspecified hemorrhoids without mention of complication   . Pain in limb   . Fever, unspecified 03/23/11  . Arthritis    Past Surgical History  Procedure Date  . No past surgeries lymph biopsy  . Video bronchoscopy 04/20/2011    Procedure: VIDEO BRONCHOSCOPY;  Surgeon: Norton Blizzard, MD;  Location: [facility] OR;  Service: Thoracic;  Laterality: N/A;  . Video mediastinoscopy 04/20/2011    Procedure: VIDEO MEDIASTINOSCOPY;  Surgeon: Norton Blizzard, MD;  Location: [facility] OR;  Service: Thoracic;  Laterality: N/A;    reports that he has never smoked. He has never used smokeless tobacco. He reports that he does not drink alcohol or use illicit drugs. family history includes Breast cancer  in his mother and Cancer in his father.  There is no history of Arthritis. Allergies  Allergen Reactions  . Ciprofloxacin Hcl Hives    Loss of appetite also..  . Amoxicillin-Pot Clavulanate Rash      Review of Systems  Constitutional: Negative for fever, activity change, appetite change and fatigue.  HENT: Negative for ear pain, congestion and trouble swallowing.   Eyes: Negative for pain and visual disturbance.  Respiratory: Negative for cough, shortness of breath and wheezing.   Cardiovascular: Negative for chest pain and palpitations.  Gastrointestinal: Negative for nausea, vomiting, abdominal pain, diarrhea, constipation, blood in stool, abdominal distention and rectal pain.  Genitourinary: Negative for dysuria, hematuria and testicular pain.  Musculoskeletal: Negative for joint swelling and arthralgias.  Skin: Negative for rash.  Neurological: Negative for dizziness, syncope and headaches.  Hematological: Negative for adenopathy.  Psychiatric/Behavioral: Negative for confusion and dysphoric mood.       Objective:   Physical Exam  Constitutional: He is oriented to person, place, and time. He appears well-developed and well-nourished. No distress.  HENT:  Head: Normocephalic and atraumatic.  Right Ear: External ear normal.  Left Ear: External ear normal.  Mouth/Throat: Oropharynx is clear and moist.  Eyes: Conjunctivae normal and EOM are normal. Pupils are equal, round, and reactive to light.  Neck: Normal range of motion. Neck supple. No thyromegaly present.  Cardiovascular: Normal rate, regular rhythm and normal heart sounds.   No murmur heard. Pulmonary/Chest: No respiratory distress. He has no wheezes. He has no rales.  Abdominal: Soft. Bowel sounds are normal. He exhibits  no distension and no mass. There is no tenderness. There is no rebound and no guarding.  Musculoskeletal: He exhibits no edema.  Lymphadenopathy:    He has no cervical adenopathy.  Neurological: He  is alert and oriented to person, place, and time. He displays normal reflexes. No cranial nerve deficit.  Skin: No rash noted.  Psychiatric: He has a normal mood and affect.          Assessment & Plan:  Complete physical. Labs reviewed with patient. Mildly elevated lipids but overall low risk. Trace blood urine dipstick. Repeat today. Pneumovax given. Flu vaccine already given. Hemoccult cards given.

## 2012-04-05 NOTE — Progress Notes (Signed)
Quick Note:  Pt informed on cell VM ______ 

## 2012-04-25 DIAGNOSIS — L4050 Arthropathic psoriasis, unspecified: Secondary | ICD-10-CM | POA: Diagnosis not present

## 2012-06-03 DIAGNOSIS — L4050 Arthropathic psoriasis, unspecified: Secondary | ICD-10-CM | POA: Diagnosis not present

## 2012-07-18 DIAGNOSIS — Z13820 Encounter for screening for osteoporosis: Secondary | ICD-10-CM | POA: Diagnosis not present

## 2012-07-18 DIAGNOSIS — L4050 Arthropathic psoriasis, unspecified: Secondary | ICD-10-CM | POA: Diagnosis not present

## 2012-08-15 DIAGNOSIS — D235 Other benign neoplasm of skin of trunk: Secondary | ICD-10-CM | POA: Diagnosis not present

## 2012-08-29 DIAGNOSIS — L4050 Arthropathic psoriasis, unspecified: Secondary | ICD-10-CM | POA: Diagnosis not present

## 2012-09-19 ENCOUNTER — Ambulatory Visit (INDEPENDENT_AMBULATORY_CARE_PROVIDER_SITE_OTHER): Payer: Medicare Other | Admitting: Family Medicine

## 2012-09-19 VITALS — BP 120/70 | Temp 98.4°F | Wt 190.0 lb

## 2012-09-19 DIAGNOSIS — S61211A Laceration without foreign body of left index finger without damage to nail, initial encounter: Secondary | ICD-10-CM

## 2012-09-19 DIAGNOSIS — S61209A Unspecified open wound of unspecified finger without damage to nail, initial encounter: Secondary | ICD-10-CM

## 2012-09-19 NOTE — Patient Instructions (Addendum)
Follow up promptly for any signs of infection-redness, pus, pain Leave steri strips in place until follow up

## 2012-09-19 NOTE — Progress Notes (Signed)
  Subjective:    Patient ID: Andrew Reid, male    DOB: 1945-10-22, 67 y.o.   MRN: [id number]  HPI Patient seen laceration left index finger   this occurred on 09/06/2012.   accidentally cut with scissors. He did not seek any medical care. Last tetanus 2009. Patient presents now because of poor healing. He has a very thick flap-like laceration. Flap of skin still has good coloration and appears to be viable. No signs of infection such as redness or any purulent drainage. He has been applying topical antibiotic daily.  Past Medical History  Diagnosis Date  . HERPES ZOSTER 12/15/2008  . Dysuria 12/10/2008  . Psoriatic arthritis   . Unspecified hemorrhoids without mention of complication   . Pain in limb   . Fever, unspecified 03/23/11  . Arthritis    Past Surgical History  Procedure Laterality Date  . No past surgeries  lymph biopsy  . Video bronchoscopy  04/20/2011    Procedure: VIDEO BRONCHOSCOPY;  Surgeon: Norton Blizzard, MD;  Location: [facility] OR;  Service: Thoracic;  Laterality: N/A;  . Video mediastinoscopy  04/20/2011    Procedure: VIDEO MEDIASTINOSCOPY;  Surgeon: Norton Blizzard, MD;  Location: [facility] OR;  Service: Thoracic;  Laterality: N/A;    reports that he has never smoked. He has never used smokeless tobacco. He reports that he does not drink alcohol or use illicit drugs. family history includes Breast cancer in his mother and Cancer in his father.  There is no history of Arthritis. Allergies  Allergen Reactions  . Ciprofloxacin Hcl Hives    Loss of appetite also..  . Amoxicillin-Pot Clavulanate Rash      Review of Systems  Constitutional: Negative for fever and chills.       Objective:   Physical Exam  Constitutional: He appears well-developed and well-nourished.  Cardiovascular: Normal rate and regular rhythm.   Pulmonary/Chest: Effort normal and breath sounds normal. No respiratory distress. He has no wheezes. He has no rales.  Musculoskeletal:  Left index  finger reveals a flap-like laceration dorsally over the PIP joint. This is a very thick layer of skin and appears to be full-thickness. This is not lying flat and flush with the skin around it. He has no surrounding erythema. No purulent drainage. Full range of motion the DIP and PIP joint          Assessment & Plan:   laceration left index finger as above. This occurred almost 2 weeks ago. No signs of secondary infection. Wound is clean. We'll try to get thick flap-like laceration in better position with Steri-Strips. Followup promptly for secondary infection

## 2012-10-03 ENCOUNTER — Ambulatory Visit: Payer: Medicare Other | Admitting: Family Medicine

## 2012-10-10 DIAGNOSIS — L4050 Arthropathic psoriasis, unspecified: Secondary | ICD-10-CM | POA: Diagnosis not present

## 2012-11-14 DIAGNOSIS — M949 Disorder of cartilage, unspecified: Secondary | ICD-10-CM | POA: Diagnosis not present

## 2012-11-14 DIAGNOSIS — L4050 Arthropathic psoriasis, unspecified: Secondary | ICD-10-CM | POA: Diagnosis not present

## 2012-11-21 DIAGNOSIS — L4050 Arthropathic psoriasis, unspecified: Secondary | ICD-10-CM | POA: Diagnosis not present

## 2013-01-02 DIAGNOSIS — L4050 Arthropathic psoriasis, unspecified: Secondary | ICD-10-CM | POA: Diagnosis not present

## 2013-01-09 ENCOUNTER — Encounter: Payer: Self-pay | Admitting: Internal Medicine

## 2013-01-09 ENCOUNTER — Ambulatory Visit (INDEPENDENT_AMBULATORY_CARE_PROVIDER_SITE_OTHER): Payer: Medicare Other | Admitting: Internal Medicine

## 2013-01-09 VITALS — BP 130/90 | HR 80 | Temp 97.8°F | Resp 20 | Wt 200.0 lb

## 2013-01-09 DIAGNOSIS — N419 Inflammatory disease of prostate, unspecified: Secondary | ICD-10-CM

## 2013-01-09 DIAGNOSIS — R3989 Other symptoms and signs involving the genitourinary system: Secondary | ICD-10-CM | POA: Diagnosis not present

## 2013-01-09 DIAGNOSIS — R39198 Other difficulties with micturition: Secondary | ICD-10-CM

## 2013-01-09 DIAGNOSIS — R300 Dysuria: Secondary | ICD-10-CM | POA: Diagnosis not present

## 2013-01-09 LAB — POCT URINALYSIS DIPSTICK
Bilirubin, UA: NEGATIVE
Blood, UA: NEGATIVE
Glucose, UA: NEGATIVE
Leukocytes, UA: NEGATIVE
Nitrite, UA: NEGATIVE
Urobilinogen, UA: 0.2
pH, UA: 7

## 2013-01-09 MED ORDER — DOXYCYCLINE HYCLATE 100 MG PO TABS: 100.0000 mg | ORAL_TABLET | Freq: Two times a day (BID) | ORAL | Status: DC

## 2013-01-09 NOTE — Patient Instructions (Addendum)
Take your antibiotic as prescribed until ALL of it is gone, but stop if you develop a rash, swelling, or any side effects of the medication.  Contact our office as soon as possible if  there are side effects of the medication.Prostatitis The prostate gland is about the size and shape of a walnut. It is located just below your bladder. It produces one of the components of semen, which is made up of sperm and the fluids that help nourish and transport it out from the testicles. Prostatitis is redness, soreness, and swelling (inflammation) of the prostate gland.  There are 3 types of prostatitis:  Acute bacterial prostatitis This is the least common type of prostatitis. It starts quickly and usually leads to a bladder infection. It can occur at any age.  Chronic bacterial prostatitis This is a persistent bacterial infection in the prostate. It usually develops from repeated acute bacterial prostatitis or acute bacterial prostatitis that was not properly treated. It can occur in men of any age but is most common in middle-aged men whose prostate has begun to enlarge.   Chronic prostatitis chronic pelvic pain syndrome This is the most common type of prostatitis. It is inflammation of the prostate gland that is not caused by a bacterial infection. The cause is unknown. CAUSES The cause of acute and chronic bacterial prostatitis is a bacterial infection. The exact cause of chronic prostatitis and chronic pelvic pain syndrome and asymptomatic inflammatory prostatitis is unknown.  SYMPTOMS  Symptoms can vary depending upon the type of prostatitis that exists. There can also be overlap in symptoms. Possible symptoms for each type of prostatitis are listed below. Acute bacterial prostatitis  Painful urination.  Fever or chills.  Muscle or joint pains.  Low back pain.  Low abdominal pain.  Inability to empty bladder completely.  Sudden urge to urinate.  Frequent urination.  Difficulty starting  urine stream.  Weak urine stream.  Discharge from the urethra.  Dribbling after urination.  Rectal pain.  Pain in the testicles, penis, or tip of the penis.  Pain in the space between the anus and scrotum (perineum).  Problems with sexual function.  Painful ejaculation.  Bloody semen. Chronic bacterial prostatitis  The symptoms are similar to those of acute bacterial prostatitis, but they usually are much less severe. Fever, chills, and muscle and joint pain are not associated with chronic bacterial prostatitis. Chronic prostatitis chronic pelvic pain syndrome  Symptoms typically include a dull ache in the scrotum and the perineum. DIAGNOSIS  In order to diagnose prostatitis, your caregiver will ask about your symptoms. If acute or chronic bacterial prostatitis is suspected, a urine sample will be taken and tested (urinalysis). This is to see if there is bacteria in your urine. If the urinalysis result is negative for bacteria, your caregiver may use a finger to feel your prostate (digital rectal exam). This exam helps your caregiver determine if your prostate is swollen and tender. TREATMENT  Treatment for prostatitis depends on the cause. If a bacterial infection is the cause, it can be treated with antibiotic medicine. In cases of chronic bacterial prostatitis, the use of antibiotics for up to 1 month may be necessary. Your caregiver may instruct you to take sitz baths to help relieve pain. A sitz bath is a bath of hot water in which your hips and buttocks are under water. HOME CARE INSTRUCTIONS   Take all medicines as directed by your caregiver.  Take sitz baths as directed by your caregiver. SEEK MEDICAL   CARE IF:   Your symptoms get worse, not better.  You have a fever. SEEK IMMEDIATE MEDICAL CARE IF:   You have chills.  You feel nauseous or vomit.  You feel lightheaded or faint.  You are unable to urinate.  You have blood or blood clots in your urine. Document  Released: 05/26/2000 Document Revised: 08/21/2011 Document Reviewed: 05/01/2011 [employer] Patient Information 2014 [employer].  

## 2013-01-09 NOTE — Progress Notes (Signed)
Subjective:    Patient ID: Andrew Reid, male    DOB: [birth year], 67 y.o.   MRN: [id number]  HPI  67 year old patient who has a remote history of acute prostatitis. For the past the week he is having prostate fullness and discomfort decreased urinary flow. Denies any dysuria or discharge. No low-grade fever but he generally feels a bit unwell. He does have allergies to both Augmentin as well as Cipro  Past Medical History  Diagnosis Date  . HERPES ZOSTER 12/15/2008  . Dysuria 12/10/2008  . Psoriatic arthritis   . Unspecified hemorrhoids without mention of complication   . Pain in limb   . Fever, unspecified 03/23/11  . Arthritis     History   Social History  . Marital Status: Married    Spouse Name: N/A    Number of Children: N/A  . Years of Education: N/A   Occupational History  . Retired Psychologist, educational    Social History Main Topics  . Smoking status: Never Smoker   . Smokeless tobacco: Never Used  . Alcohol Use: No  . Drug Use: No  . Sexually Active: Not on file   Other Topics Concern  . Not on file   Social History Narrative  . No narrative on file    Past Surgical History  Procedure Laterality Date  . No past surgeries  lymph biopsy  . Video bronchoscopy  04/20/2011    Procedure: VIDEO BRONCHOSCOPY;  Surgeon: Norton Blizzard, MD;  Location: [facility] OR;  Service: Thoracic;  Laterality: N/A;  . Video mediastinoscopy  04/20/2011    Procedure: VIDEO MEDIASTINOSCOPY;  Surgeon: Norton Blizzard, MD;  Location: [facility] OR;  Service: Thoracic;  Laterality: N/A;    Family History  Problem Relation Age of Onset  . Breast cancer Mother   . Cancer Father     sternum?  Marland Kitchen Arthritis Neg Hx     Allergies  Allergen Reactions  . Ciprofloxacin Hcl Hives    Loss of appetite also..  . Amoxicillin-Pot Clavulanate Rash    Current Outpatient Prescriptions on File Prior to Visit  Medication Sig Dispense Refill  . Calcium Carb-Cholecalciferol [phone] MG-UNIT CAPS Take by mouth.      .  meloxicam (MOBIC) 15 MG tablet Take 15 mg by mouth daily.        . methotrexate (RHEUMATREX) 2.5 MG tablet 6 tabs once weekly per Dr Dierdre Forth      . predniSONE (DELTASONE) 2.5 MG tablet Take 1 mg by mouth daily. Per dr Dierdre Forth       No current facility-administered medications on file prior to visit.    BP 130/90  Pulse 80  Temp(Src) 97.8 F (36.6 C) (Oral)  Resp 20  Wt 200 lb (90.719 kg)  BMI 29.52 kg/m2  SpO2 97%       Review of Systems  Constitutional: Negative for fever, chills, appetite change and fatigue.  HENT: Negative for hearing loss, ear pain, congestion, sore throat, trouble swallowing, neck stiffness, dental problem, voice change and tinnitus.   Eyes: Negative for pain, discharge and visual disturbance.  Respiratory: Negative for cough, chest tightness, wheezing and stridor.   Cardiovascular: Negative for chest pain, palpitations and leg swelling.  Gastrointestinal: Negative for nausea, vomiting, abdominal pain, diarrhea, constipation, blood in stool and abdominal distention.  Genitourinary: Positive for decreased urine volume and difficulty urinating. Negative for urgency, hematuria, flank pain, discharge and genital sores.  Musculoskeletal: Negative for myalgias, back pain, joint swelling, arthralgias and gait problem.  Skin: Negative  for rash.  Neurological: Negative for dizziness, syncope, speech difficulty, weakness, numbness and headaches.  Hematological: Negative for adenopathy. Does not bruise/bleed easily.  Psychiatric/Behavioral: Negative for behavioral problems and dysphoric mood. The patient is not nervous/anxious.        Objective:   Physical Exam  Constitutional: He appears well-developed and well-nourished. No distress.  Genitourinary:  Prostate mildly enlarged and slightly tender. Palpation of the prostate gland does reproduce his symptoms          Assessment & Plan:   Subacute prostatitis. Will treat with doxycycline for 4 weeks. He'll  call if unimproved History of psoriatic arthritis on immunosuppressive therapy

## 2013-01-15 ENCOUNTER — Ambulatory Visit: Payer: Medicare Other | Admitting: Family Medicine

## 2013-02-13 DIAGNOSIS — L4050 Arthropathic psoriasis, unspecified: Secondary | ICD-10-CM | POA: Diagnosis not present

## 2013-03-11 DIAGNOSIS — H2510 Age-related nuclear cataract, unspecified eye: Secondary | ICD-10-CM | POA: Diagnosis not present

## 2013-03-11 DIAGNOSIS — H40039 Anatomical narrow angle, unspecified eye: Secondary | ICD-10-CM | POA: Diagnosis not present

## 2013-03-13 DIAGNOSIS — L4050 Arthropathic psoriasis, unspecified: Secondary | ICD-10-CM | POA: Diagnosis not present

## 2013-03-13 DIAGNOSIS — M899 Disorder of bone, unspecified: Secondary | ICD-10-CM | POA: Diagnosis not present

## 2013-03-27 DIAGNOSIS — L4050 Arthropathic psoriasis, unspecified: Secondary | ICD-10-CM | POA: Diagnosis not present

## 2013-05-05 DIAGNOSIS — L4050 Arthropathic psoriasis, unspecified: Secondary | ICD-10-CM | POA: Diagnosis not present

## 2013-06-19 DIAGNOSIS — L4050 Arthropathic psoriasis, unspecified: Secondary | ICD-10-CM | POA: Diagnosis not present

## 2013-07-17 DIAGNOSIS — M19049 Primary osteoarthritis, unspecified hand: Secondary | ICD-10-CM | POA: Diagnosis not present

## 2013-07-17 DIAGNOSIS — M79609 Pain in unspecified limb: Secondary | ICD-10-CM | POA: Diagnosis not present

## 2013-07-17 DIAGNOSIS — M899 Disorder of bone, unspecified: Secondary | ICD-10-CM | POA: Diagnosis not present

## 2013-07-17 DIAGNOSIS — L4050 Arthropathic psoriasis, unspecified: Secondary | ICD-10-CM | POA: Diagnosis not present

## 2013-07-17 DIAGNOSIS — M949 Disorder of cartilage, unspecified: Secondary | ICD-10-CM | POA: Diagnosis not present

## 2013-07-31 DIAGNOSIS — L4050 Arthropathic psoriasis, unspecified: Secondary | ICD-10-CM | POA: Diagnosis not present

## 2013-08-14 ENCOUNTER — Encounter: Payer: Self-pay | Admitting: Family Medicine

## 2013-08-14 ENCOUNTER — Ambulatory Visit (INDEPENDENT_AMBULATORY_CARE_PROVIDER_SITE_OTHER): Payer: Medicare Other | Admitting: Family Medicine

## 2013-08-14 VITALS — BP 120/80 | HR 60 | Temp 97.7°F | Ht 69.0 in | Wt 201.0 lb

## 2013-08-14 DIAGNOSIS — Z0000 Encounter for general adult medical examination without abnormal findings: Secondary | ICD-10-CM | POA: Diagnosis not present

## 2013-08-14 DIAGNOSIS — IMO0001 Reserved for inherently not codable concepts without codable children: Secondary | ICD-10-CM

## 2013-08-14 DIAGNOSIS — I712 Thoracic aortic aneurysm, without rupture, unspecified: Secondary | ICD-10-CM | POA: Diagnosis not present

## 2013-08-14 DIAGNOSIS — E785 Hyperlipidemia, unspecified: Secondary | ICD-10-CM

## 2013-08-14 DIAGNOSIS — Z125 Encounter for screening for malignant neoplasm of prostate: Secondary | ICD-10-CM | POA: Diagnosis not present

## 2013-08-14 LAB — LIPID PANEL
CHOLESTEROL: 200 mg/dL (ref 0–200)
HDL: 49.1 mg/dL (ref >39.00)
LDL CALC: 129 mg/dL — AB (ref 0–99)
Total CHOL/HDL Ratio: 4
Triglycerides: 109 mg/dL (ref 0.0–149.0)
VLDL: 21.8 mg/dL (ref 0.0–40.0)

## 2013-08-14 LAB — BASIC METABOLIC PANEL
BUN: 19 mg/dL (ref 6–23)
CALCIUM: 9.1 mg/dL (ref 8.4–10.5)
CO2: 26 mEq/L (ref 19–32)
CREATININE: 0.9 mg/dL (ref 0.4–1.5)
Chloride: 104 mEq/L (ref 96–112)
GFR: 84.85 mL/min (ref >60.00)
Glucose, Bld: 73 mg/dL (ref 70–99)
POTASSIUM: 4.1 meq/L (ref 3.5–5.1)
Sodium: 136 mEq/L (ref 135–145)

## 2013-08-14 LAB — PSA: PSA: 0.56 ng/mL (ref 0.10–4.00)

## 2013-08-14 NOTE — Progress Notes (Signed)
Subjective:    Patient ID: Andrew Reid, male    DOB: [birth year], 68 y.o.   MRN: [id number]  HPI Patient seen for Medicare wellness exam. His chronic problems include history of psoriatic arthritis, hemo-phagocytic lymphohistiocytosis. He is followed regularly by rheumatologist. He remains on low-dose methotrexate. He cannot take shingles vaccine because of that. He had CT chest and working up his hematologic disorder couple years ago was noted to have very small thoracic aneurysm. He has not any followup since then. No chest pains. No dyspnea. No dizziness. No syncope. Overall feels well.  Past Medical History  Diagnosis Date  . HERPES ZOSTER 12/15/2008  . Dysuria 12/10/2008  . Psoriatic arthritis   . Unspecified hemorrhoids without mention of complication   . Pain in limb   . Fever, unspecified 03/23/11  . Arthritis    Past Surgical History  Procedure Laterality Date  . No past surgeries  lymph biopsy  . Video bronchoscopy  04/20/2011    Procedure: VIDEO BRONCHOSCOPY;  Surgeon: Pierre Bali, MD;  Location: [place];  Service: Thoracic;  Laterality: N/A;  . Video mediastinoscopy  04/20/2011    Procedure: VIDEO MEDIASTINOSCOPY;  Surgeon: Pierre Bali, MD;  Location: [place];  Service: Thoracic;  Laterality: N/A;    reports that he has never smoked. He has never used smokeless tobacco. He reports that he does not drink alcohol or use illicit drugs. family history includes Breast cancer in his mother; Cancer in his father. There is no history of Arthritis. Allergies  Allergen Reactions  . Ciprofloxacin Hcl Hives    Loss of appetite also..  . Amoxicillin-Pot Clavulanate Rash   1.  Risk factors based on Past Medical , Social, and Family history as above and reviewed. 2.  Limitations in physical activities none no recent falls. 3.  Depression/mood no depression issues. 4.  Hearing no deficits. 5.  ADLs independent 6.  Cognitive function (orientation to time and place, language,  writing, speech,memory) no memory deficits,  Judgement intact 7.  Home Safety no issues. 8.  Height, weight, and visual acuity.all stable. 9.  Counseling discussed pros and cons of prostate cancer screening. 10. Recommendation of preventive services. Prevnar 13.  No shingles vaccine secondary to immunosuppressant therapy. 11. Labs based on risk factors PSA, lipids, BMP 12. Care Plan as above.    Review of Systems  Constitutional: Negative for fever, activity change, appetite change and fatigue.  HENT: Negative for congestion, ear pain and trouble swallowing.   Eyes: Negative for pain and visual disturbance.  Respiratory: Negative for cough, shortness of breath and wheezing.   Cardiovascular: Negative for chest pain and palpitations.  Gastrointestinal: Negative for nausea, vomiting, abdominal pain, diarrhea, constipation, blood in stool, abdominal distention and rectal pain.  Endocrine: Negative for polydipsia and polyuria.  Genitourinary: Negative for dysuria, hematuria and testicular pain.  Musculoskeletal: Negative for arthralgias and joint swelling.  Skin: Negative for rash.  Neurological: Negative for dizziness, syncope and headaches.  Hematological: Negative for adenopathy.  Psychiatric/Behavioral: Negative for confusion and dysphoric mood.       Objective:   Physical Exam  Constitutional: He is oriented to person, place, and time. He appears well-developed and well-nourished. No distress.  HENT:  Head: Normocephalic and atraumatic.  Right Ear: External ear normal.  Left Ear: External ear normal.  Mouth/Throat: Oropharynx is clear and moist.  Eyes: Conjunctivae and EOM are normal. Pupils are equal, round, and reactive to light.  Neck: Normal range of motion. Neck supple. No  thyromegaly present.  Cardiovascular: Normal rate, regular rhythm and normal heart sounds.   No murmur heard. Pulmonary/Chest: No respiratory distress. He has no wheezes. He has no rales.  Abdominal:  Soft. Bowel sounds are normal. He exhibits no distension and no mass. There is no tenderness. There is no rebound and no guarding.  Genitourinary: Prostate normal.  No rectal mass  Musculoskeletal: He exhibits no edema.  Lymphadenopathy:    He has no cervical adenopathy.  Neurological: He is alert and oriented to person, place, and time. He displays normal reflexes. No cranial nerve deficit.  Skin: No rash noted.  Psychiatric: He has a normal mood and affect.          Assessment & Plan:  #1 health maintenance. Not a candidate for shingles vaccine with methotrexate therapy. Consider Prevnar 13. Discussed pros and cons of PSA screening and patient requests. #2 history of mild hyperlipidemia. Recheck lipid and basic metabolic panel #3 history of incidental mild thoracic aneurysm on previous CT scanning over 2 years ago. Obtain followup imaging-echo.

## 2013-08-14 NOTE — Progress Notes (Signed)
Pre visit review using our clinic review tool, if applicable. No additional management support is needed unless otherwise documented below in the visit note. 

## 2013-08-19 ENCOUNTER — Telehealth: Payer: Self-pay | Admitting: Family Medicine

## 2013-08-19 NOTE — Telephone Encounter (Signed)
Pt would like to know if you confirmed his insurance will pauy for his echo?

## 2013-08-20 NOTE — Telephone Encounter (Signed)
Spoke with patient, he is aware that AAPR does not require precert as MCR is primary.

## 2013-08-21 ENCOUNTER — Other Ambulatory Visit (HOSPITAL_COMMUNITY): Payer: Medicare Other

## 2013-09-04 ENCOUNTER — Ambulatory Visit (HOSPITAL_COMMUNITY): Payer: Medicare Other | Attending: Internal Medicine | Admitting: Radiology

## 2013-09-04 DIAGNOSIS — IMO0001 Reserved for inherently not codable concepts without codable children: Secondary | ICD-10-CM

## 2013-09-04 DIAGNOSIS — I712 Thoracic aortic aneurysm, without rupture, unspecified: Secondary | ICD-10-CM | POA: Diagnosis not present

## 2013-09-04 NOTE — Progress Notes (Signed)
Echocardiogram Performed. 

## 2013-09-11 DIAGNOSIS — L4050 Arthropathic psoriasis, unspecified: Secondary | ICD-10-CM | POA: Diagnosis not present

## 2013-10-23 DIAGNOSIS — L4050 Arthropathic psoriasis, unspecified: Secondary | ICD-10-CM | POA: Diagnosis not present

## 2013-12-04 DIAGNOSIS — L4050 Arthropathic psoriasis, unspecified: Secondary | ICD-10-CM | POA: Diagnosis not present

## 2013-12-18 DIAGNOSIS — L4050 Arthropathic psoriasis, unspecified: Secondary | ICD-10-CM | POA: Diagnosis not present

## 2013-12-18 DIAGNOSIS — M899 Disorder of bone, unspecified: Secondary | ICD-10-CM | POA: Diagnosis not present

## 2013-12-18 DIAGNOSIS — M949 Disorder of cartilage, unspecified: Secondary | ICD-10-CM | POA: Diagnosis not present

## 2014-01-22 DIAGNOSIS — L4050 Arthropathic psoriasis, unspecified: Secondary | ICD-10-CM | POA: Diagnosis not present

## 2014-03-05 DIAGNOSIS — L4050 Arthropathic psoriasis, unspecified: Secondary | ICD-10-CM | POA: Diagnosis not present

## 2014-04-16 DIAGNOSIS — Z23 Encounter for immunization: Secondary | ICD-10-CM | POA: Diagnosis not present

## 2014-04-16 DIAGNOSIS — L4050 Arthropathic psoriasis, unspecified: Secondary | ICD-10-CM | POA: Diagnosis not present

## 2014-04-16 DIAGNOSIS — M8580 Other specified disorders of bone density and structure, unspecified site: Secondary | ICD-10-CM | POA: Diagnosis not present

## 2014-05-28 DIAGNOSIS — L4050 Arthropathic psoriasis, unspecified: Secondary | ICD-10-CM | POA: Diagnosis not present

## 2014-07-09 DIAGNOSIS — L4050 Arthropathic psoriasis, unspecified: Secondary | ICD-10-CM | POA: Diagnosis not present

## 2014-08-13 DIAGNOSIS — M8580 Other specified disorders of bone density and structure, unspecified site: Secondary | ICD-10-CM | POA: Diagnosis not present

## 2014-08-13 DIAGNOSIS — L4050 Arthropathic psoriasis, unspecified: Secondary | ICD-10-CM | POA: Diagnosis not present

## 2014-08-20 DIAGNOSIS — L4050 Arthropathic psoriasis, unspecified: Secondary | ICD-10-CM | POA: Diagnosis not present

## 2014-10-08 DIAGNOSIS — L4050 Arthropathic psoriasis, unspecified: Secondary | ICD-10-CM | POA: Diagnosis not present

## 2014-11-26 DIAGNOSIS — L4050 Arthropathic psoriasis, unspecified: Secondary | ICD-10-CM | POA: Diagnosis not present

## 2014-12-17 DIAGNOSIS — L4050 Arthropathic psoriasis, unspecified: Secondary | ICD-10-CM | POA: Diagnosis not present

## 2014-12-17 DIAGNOSIS — M8580 Other specified disorders of bone density and structure, unspecified site: Secondary | ICD-10-CM | POA: Diagnosis not present

## 2015-01-14 DIAGNOSIS — R5383 Other fatigue: Secondary | ICD-10-CM | POA: Diagnosis not present

## 2015-01-14 DIAGNOSIS — L4050 Arthropathic psoriasis, unspecified: Secondary | ICD-10-CM | POA: Diagnosis not present

## 2015-03-04 DIAGNOSIS — L4050 Arthropathic psoriasis, unspecified: Secondary | ICD-10-CM | POA: Diagnosis not present

## 2015-03-11 ENCOUNTER — Encounter: Payer: Self-pay | Admitting: Family Medicine

## 2015-03-11 ENCOUNTER — Ambulatory Visit (INDEPENDENT_AMBULATORY_CARE_PROVIDER_SITE_OTHER): Payer: Medicare Other | Admitting: Family Medicine

## 2015-03-11 VITALS — BP 120/80 | HR 64 | Temp 98.1°F | Ht 69.29 in | Wt 197.0 lb

## 2015-03-11 DIAGNOSIS — E785 Hyperlipidemia, unspecified: Secondary | ICD-10-CM | POA: Insufficient documentation

## 2015-03-11 DIAGNOSIS — Z0000 Encounter for general adult medical examination without abnormal findings: Secondary | ICD-10-CM | POA: Diagnosis not present

## 2015-03-11 DIAGNOSIS — I712 Thoracic aortic aneurysm, without rupture, unspecified: Secondary | ICD-10-CM

## 2015-03-11 DIAGNOSIS — Z23 Encounter for immunization: Secondary | ICD-10-CM

## 2015-03-11 LAB — LIPID PANEL
CHOLESTEROL: 212 mg/dL — AB (ref 0–200)
HDL: 52.1 mg/dL (ref >39.00)
LDL Cholesterol: 141 mg/dL — ABNORMAL HIGH (ref 0–99)
NonHDL: 160.35
TRIGLYCERIDES: 98 mg/dL (ref 0.0–149.0)
Total CHOL/HDL Ratio: 4
VLDL: 19.6 mg/dL (ref 0.0–40.0)

## 2015-03-11 NOTE — Patient Instructions (Addendum)
Remember colonoscopy by next year.

## 2015-03-11 NOTE — Progress Notes (Signed)
Subjective:    Patient ID: Andrew Reid, male    DOB: 10-28-1945, 68 y.o.   MRN: [id number]  HPI Patient is here for Medicare Wellness exam and medical follow-up. Medical history significant for history of hemophagocytic lymphohistiocytosis, psoriatic arthritis, asending thoracic aortic aneurysm, and mild hyperlipidemia. Last echo to assess his aneurysm was over one year ago. This measured 4.4 cm 3/15. He has not had any recent chest pains. No dyspnea.  Mild hyperlipidemia. No family history of premature CAD. Psoriatic arthritis followed by rheumatology and is on methotrexate and Remicade. Arthralgias and skin rash well-controlled Cannot take shingles vaccine. He needs flu vaccine and Prevnar 13. Will need repeat colonoscopy next year.  Past Medical History  Diagnosis Date  . HERPES ZOSTER 12/15/2008  . Dysuria 12/10/2008  . Psoriatic arthritis   . Unspecified hemorrhoids without mention of complication   . Pain in limb   . Fever, unspecified 03/23/11  . Arthritis    Past Surgical History  Procedure Laterality Date  . No past surgeries  lymph biopsy  . Video bronchoscopy  04/20/2011    Procedure: VIDEO BRONCHOSCOPY;  Surgeon: Pierre Bali, MD;  Location: [place];  Service: Thoracic;  Laterality: N/A;  . Video mediastinoscopy  04/20/2011    Procedure: VIDEO MEDIASTINOSCOPY;  Surgeon: Pierre Bali, MD;  Location: [place];  Service: Thoracic;  Laterality: N/A;    reports that he has never smoked. He has never used smokeless tobacco. He reports that he does not drink alcohol or use illicit drugs. family history includes Breast cancer in his mother; Cancer in his father. There is no history of Arthritis. Allergies  Allergen Reactions  . Ciprofloxacin Hcl Hives    Loss of appetite also..  . Amoxicillin-Pot Clavulanate Rash   1.  Risk factors based on Past Medical , Social, and Family history reviewed and as indicated above with no changes 2.  Limitations in physical activities None.   No recent falls. 3.  Depression/mood No active depression or anxiety issues 4.  Hearing No defiits 5.  ADLs independent in all. 6.  Cognitive function (orientation to time and place, language, writing, speech,memory) no short or long term memory issues.  Language and judgement intact. 7.  Home Safety no issues 8.  Height, weight, and visual acuity.all stable. 9.  Counseling discussed continued regular exercise 10. Recommendation of preventive services. Prevnar 13 and flu vaccine 11. Labs based on risk factors lipid panel 12. Care Plan set up repeat echocardiogram to reassess ascending thoracic aneurysm 13. Other Providers Dr Amil Amen, rheumatology. 14. Written schedule of screening/prevention services given to patient.    Review of Systems  Constitutional: Negative for fever, activity change, appetite change and fatigue.  HENT: Negative for congestion, ear pain and trouble swallowing.   Eyes: Negative for pain and visual disturbance.  Respiratory: Negative for cough, shortness of breath and wheezing.   Cardiovascular: Negative for chest pain and palpitations.  Gastrointestinal: Negative for nausea, vomiting, abdominal pain, diarrhea, constipation, blood in stool, abdominal distention and rectal pain.  Genitourinary: Negative for dysuria, hematuria and testicular pain.  Musculoskeletal: Negative for joint swelling and arthralgias.  Skin: Negative for rash.  Neurological: Negative for dizziness, syncope and headaches.  Hematological: Negative for adenopathy.  Psychiatric/Behavioral: Negative for confusion and dysphoric mood.       Objective:   Physical Exam  Constitutional: He is oriented to person, place, and time. He appears well-developed and well-nourished. No distress.  HENT:  Head: Normocephalic and atraumatic.  Right  Ear: External ear normal.  Left Ear: External ear normal.  Mouth/Throat: Oropharynx is clear and moist.  Eyes: Conjunctivae and EOM are normal. Pupils are  equal, round, and reactive to light.  Neck: Normal range of motion. Neck supple. No thyromegaly present.  Cardiovascular: Normal rate, regular rhythm and normal heart sounds.   No murmur heard. Pulmonary/Chest: No respiratory distress. He has no wheezes. He has no rales.  Abdominal: Soft. Bowel sounds are normal. He exhibits no distension and no mass. There is no tenderness. There is no rebound and no guarding.  Musculoskeletal: He exhibits no edema.  Lymphadenopathy:    He has no cervical adenopathy.  Neurological: He is alert and oriented to person, place, and time. He displays normal reflexes. No cranial nerve deficit.  Skin:  Minimal scaly rash on both elbows extensor surface  Psychiatric: He has a normal mood and affect.          Assessment & Plan:  #1 Medicare wellness exam. Flu vaccine and Prevnar 13 given. Repeat colonoscopy next year #2 history of mild hyperlipidemia. Recheck lipid panel #3 history of ascending thoracic aortic aneurysm. Last screen over one year ago 4.4 cm. Set up repeat echocardiogram

## 2015-03-11 NOTE — Progress Notes (Signed)
Pre visit review using our clinic review tool, if applicable. No additional management support is needed unless otherwise documented below in the visit note. 

## 2015-03-15 ENCOUNTER — Telehealth: Payer: Self-pay | Admitting: Family Medicine

## 2015-03-15 NOTE — Telephone Encounter (Deleted)
disregard [facility] changed the order  and call the pt to schedule

## 2015-03-15 NOTE — Telephone Encounter (Deleted)
Per [facility] THIS order need to be changed the order is incorrect they do not not do a echo limited , need to be enter correctly before pt can be scheduled , any questions on how the order should be enter please call [phone] [facility]  Scheduling    Jacori, [place]rooney [[id number]]  Order #: [id number] Procedure: ECHO LIMITED ---- incorrect order Order Date: 03/11/2015 Proc Category: Cup Echo  Diagnosis: Aneurysm of thoracic aorta ([place])   Sched Instruct:    Visit Types: ECHOCARDIOGRAM [[id number]]  Comment: Follow up ascending thoracic aortic aneurysm

## 2015-03-16 ENCOUNTER — Telehealth (HOSPITAL_COMMUNITY): Payer: Self-pay | Admitting: *Deleted

## 2015-03-16 NOTE — Telephone Encounter (Signed)
There was no message attached.  Please clarify

## 2015-03-30 ENCOUNTER — Other Ambulatory Visit: Payer: Self-pay | Admitting: Family Medicine

## 2015-03-30 ENCOUNTER — Other Ambulatory Visit: Payer: Self-pay

## 2015-03-30 ENCOUNTER — Ambulatory Visit (HOSPITAL_COMMUNITY): Payer: Medicare Other | Attending: Internal Medicine

## 2015-03-30 DIAGNOSIS — E785 Hyperlipidemia, unspecified: Secondary | ICD-10-CM | POA: Insufficient documentation

## 2015-03-30 DIAGNOSIS — I517 Cardiomegaly: Secondary | ICD-10-CM | POA: Insufficient documentation

## 2015-03-30 DIAGNOSIS — I071 Rheumatic tricuspid insufficiency: Secondary | ICD-10-CM | POA: Insufficient documentation

## 2015-03-30 DIAGNOSIS — I712 Thoracic aortic aneurysm, without rupture, unspecified: Secondary | ICD-10-CM

## 2015-03-30 DIAGNOSIS — I77810 Thoracic aortic ectasia: Secondary | ICD-10-CM | POA: Insufficient documentation

## 2015-04-15 DIAGNOSIS — M8580 Other specified disorders of bone density and structure, unspecified site: Secondary | ICD-10-CM | POA: Diagnosis not present

## 2015-04-15 DIAGNOSIS — L4059 Other psoriatic arthropathy: Secondary | ICD-10-CM | POA: Diagnosis not present

## 2015-04-22 DIAGNOSIS — L4050 Arthropathic psoriasis, unspecified: Secondary | ICD-10-CM | POA: Diagnosis not present

## 2015-06-17 DIAGNOSIS — Z79899 Other long term (current) drug therapy: Secondary | ICD-10-CM | POA: Diagnosis not present

## 2015-06-17 DIAGNOSIS — L4050 Arthropathic psoriasis, unspecified: Secondary | ICD-10-CM | POA: Diagnosis not present

## 2015-07-29 DIAGNOSIS — L570 Actinic keratosis: Secondary | ICD-10-CM | POA: Diagnosis not present

## 2015-07-29 DIAGNOSIS — D225 Melanocytic nevi of trunk: Secondary | ICD-10-CM | POA: Diagnosis not present

## 2015-07-29 DIAGNOSIS — C44310 Basal cell carcinoma of skin of unspecified parts of face: Secondary | ICD-10-CM | POA: Diagnosis not present

## 2015-07-29 DIAGNOSIS — L259 Unspecified contact dermatitis, unspecified cause: Secondary | ICD-10-CM | POA: Diagnosis not present

## 2015-07-29 DIAGNOSIS — X32XXXD Exposure to sunlight, subsequent encounter: Secondary | ICD-10-CM | POA: Diagnosis not present

## 2015-08-05 DIAGNOSIS — L4050 Arthropathic psoriasis, unspecified: Secondary | ICD-10-CM | POA: Diagnosis not present

## 2015-08-05 DIAGNOSIS — Z79899 Other long term (current) drug therapy: Secondary | ICD-10-CM | POA: Diagnosis not present

## 2015-08-12 DIAGNOSIS — Z08 Encounter for follow-up examination after completed treatment for malignant neoplasm: Secondary | ICD-10-CM | POA: Diagnosis not present

## 2015-08-12 DIAGNOSIS — D044 Carcinoma in situ of skin of scalp and neck: Secondary | ICD-10-CM | POA: Diagnosis not present

## 2015-08-12 DIAGNOSIS — Z85828 Personal history of other malignant neoplasm of skin: Secondary | ICD-10-CM | POA: Diagnosis not present

## 2015-08-19 DIAGNOSIS — L4059 Other psoriatic arthropathy: Secondary | ICD-10-CM | POA: Diagnosis not present

## 2015-08-19 DIAGNOSIS — M8580 Other specified disorders of bone density and structure, unspecified site: Secondary | ICD-10-CM | POA: Diagnosis not present

## 2015-09-23 DIAGNOSIS — Z79899 Other long term (current) drug therapy: Secondary | ICD-10-CM | POA: Diagnosis not present

## 2015-09-23 DIAGNOSIS — L4050 Arthropathic psoriasis, unspecified: Secondary | ICD-10-CM | POA: Diagnosis not present

## 2015-09-30 DIAGNOSIS — Z85828 Personal history of other malignant neoplasm of skin: Secondary | ICD-10-CM | POA: Diagnosis not present

## 2015-09-30 DIAGNOSIS — Z08 Encounter for follow-up examination after completed treatment for malignant neoplasm: Secondary | ICD-10-CM | POA: Diagnosis not present

## 2015-11-18 DIAGNOSIS — L4050 Arthropathic psoriasis, unspecified: Secondary | ICD-10-CM | POA: Diagnosis not present

## 2015-11-18 DIAGNOSIS — Z79899 Other long term (current) drug therapy: Secondary | ICD-10-CM | POA: Diagnosis not present

## 2015-12-23 DIAGNOSIS — L4059 Other psoriatic arthropathy: Secondary | ICD-10-CM | POA: Diagnosis not present

## 2015-12-23 DIAGNOSIS — M8580 Other specified disorders of bone density and structure, unspecified site: Secondary | ICD-10-CM | POA: Diagnosis not present

## 2016-01-13 DIAGNOSIS — L4050 Arthropathic psoriasis, unspecified: Secondary | ICD-10-CM | POA: Diagnosis not present

## 2016-01-13 DIAGNOSIS — Z79899 Other long term (current) drug therapy: Secondary | ICD-10-CM | POA: Diagnosis not present

## 2016-03-09 DIAGNOSIS — Z79899 Other long term (current) drug therapy: Secondary | ICD-10-CM | POA: Diagnosis not present

## 2016-03-09 DIAGNOSIS — L4050 Arthropathic psoriasis, unspecified: Secondary | ICD-10-CM | POA: Diagnosis not present

## 2016-03-23 DIAGNOSIS — Z23 Encounter for immunization: Secondary | ICD-10-CM | POA: Diagnosis not present

## 2016-03-23 DIAGNOSIS — M8580 Other specified disorders of bone density and structure, unspecified site: Secondary | ICD-10-CM | POA: Diagnosis not present

## 2016-03-23 DIAGNOSIS — L4059 Other psoriatic arthropathy: Secondary | ICD-10-CM | POA: Diagnosis not present

## 2016-04-04 ENCOUNTER — Encounter: Payer: Self-pay | Admitting: Internal Medicine

## 2016-05-09 DIAGNOSIS — Z79899 Other long term (current) drug therapy: Secondary | ICD-10-CM | POA: Diagnosis not present

## 2016-05-09 DIAGNOSIS — L4050 Arthropathic psoriasis, unspecified: Secondary | ICD-10-CM | POA: Diagnosis not present

## 2016-06-08 ENCOUNTER — Ambulatory Visit: Payer: Medicare Other | Admitting: *Deleted

## 2016-06-08 VITALS — Ht 70.0 in | Wt 199.4 lb

## 2016-06-08 DIAGNOSIS — Z1211 Encounter for screening for malignant neoplasm of colon: Secondary | ICD-10-CM

## 2016-06-08 DIAGNOSIS — K219 Gastro-esophageal reflux disease without esophagitis: Secondary | ICD-10-CM

## 2016-06-08 MED ORDER — NA SULFATE-K SULFATE-MG SULF 17.5-3.13-1.6 GM/177ML PO SOLN: 1.0000 | Freq: Once | ORAL | 0 refills | Status: AC

## 2016-06-08 NOTE — Progress Notes (Signed)
Denies allergies to eggs or soy products. Denies complications with sedation or anesthesia. Denies O2 use. Denies use of diet or weight loss medications.  Emmi instructions given for colonoscopy.  

## 2016-06-22 ENCOUNTER — Ambulatory Visit (AMBULATORY_SURGERY_CENTER): Payer: Medicare Other | Admitting: Internal Medicine

## 2016-06-22 ENCOUNTER — Encounter: Payer: Self-pay | Admitting: Internal Medicine

## 2016-06-22 VITALS — BP 110/71 | HR 62 | Temp 97.3°F | Resp 16 | Ht 70.0 in | Wt 199.0 lb

## 2016-06-22 DIAGNOSIS — Z1212 Encounter for screening for malignant neoplasm of rectum: Secondary | ICD-10-CM

## 2016-06-22 DIAGNOSIS — Z1211 Encounter for screening for malignant neoplasm of colon: Secondary | ICD-10-CM | POA: Diagnosis not present

## 2016-06-22 DIAGNOSIS — K219 Gastro-esophageal reflux disease without esophagitis: Secondary | ICD-10-CM

## 2016-06-22 MED ORDER — SODIUM CHLORIDE 0.9 % IV SOLN: 500.0000 mL | INTRAVENOUS | Status: DC

## 2016-06-22 NOTE — Op Note (Signed)
[facility] Patient Name: Andrew Reid Procedure Date: 06/22/2016 7:55 AM MRN: [id number] Endoscopist: Docia Chuck. Henrene Pastor , MD Age: 71 Referring MD:  Date of Birth: 05/27/1946 Gender: Male Account #: [id number] Procedure:                Colonoscopy Indications:              Screening for colorectal malignant neoplasm.                            Negative exam elsewhere 10 yrs ago Medicines:                Monitored Anesthesia Care Procedure:                Pre-Anesthesia Assessment:                           - Prior to the procedure, a History and Physical                            was performed, and patient medications and                            allergies were reviewed. The patient's tolerance of                            previous anesthesia was also reviewed. The risks                            and benefits of the procedure and the sedation                            options and risks were discussed with the patient.                            All questions were answered, and informed consent                            was obtained. Prior Anticoagulants: The patient has                            taken no previous anticoagulant or antiplatelet                            agents. ASA Grade Assessment: II - A patient with                            mild systemic disease. After reviewing the risks                            and benefits, the patient was deemed in                            satisfactory condition to undergo the procedure.  After obtaining informed consent, the colonoscope                            was passed under direct vision. Throughout the                            procedure, the patient's blood pressure, pulse, and                            oxygen saturations were monitored continuously. The                            Model CF-HQ190L [id number]) scope was introduced                            through the anus and advanced to  the the cecum,                            identified by appendiceal orifice and ileocecal                            valve. The ileocecal valve, appendiceal orifice,                            and rectum were photographed. The quality of the                            bowel preparation was excellent. The colonoscopy                            was performed without difficulty. The patient                            tolerated the procedure well. The bowel preparation                            used was SUPREP. Scope In: 8:21:43 AM Scope Out: 8:39:38 AM Scope Withdrawal Time: 0 hours 14 minutes 11 seconds  Total Procedure Duration: 0 hours 17 minutes 55 seconds  Findings:                 Multiple medium-mouthed diverticula were found in                            the sigmoid colon.                           Internal hemorrhoids were found during retroflexion.                           The exam was otherwise without abnormality on                            direct and retroflexion views. Complications:            No immediate complications.  Estimated blood loss:                            None. Estimated Blood Loss:     Estimated blood loss: none. Impression:               - Diverticulosis in the sigmoid colon.                           - Internal hemorrhoids.                           - The examination was otherwise normal on direct                            and retroflexion views.                           - No specimens collected. Recommendation:           - Repeat colonoscopy in 10 years for screening                            purposes.                           - Patient has a contact number available for                            emergencies. The signs and symptoms of potential                            delayed complications were discussed with the                            patient. Return to normal activities tomorrow.                            Written discharge instructions  were provided to the                            patient.                           - Resume previous diet.                           - Continue present medications. Docia Chuck. Henrene Pastor, MD 06/22/2016 8:52:47 AM This report has been signed electronically.

## 2016-06-22 NOTE — Op Note (Signed)
Andrew Reid Patient Name: Andrew Reid Procedure Date: 06/22/2016 7:55 AM MRN: [id number] Endoscopist: Docia Chuck. Henrene Pastor , MD Age: 71 Referring MD:  Date of Birth: 11-Jun-1946 Gender: Male Account #: [id number] Procedure:                Upper GI endoscopy Indications:              Screening for Barrett's esophagus, Suspected                            esophageal reflux Medicines:                Monitored Anesthesia Care Procedure:                Pre-Anesthesia Assessment:                           - Prior to the procedure, a History and Physical                            was performed, and patient medications and                            allergies were reviewed. The patient's tolerance of                            previous anesthesia was also reviewed. The risks                            and benefits of the procedure and the sedation                            options and risks were discussed with the patient.                            All questions were answered, and informed consent                            was obtained. Prior Anticoagulants: The patient has                            taken no previous anticoagulant or antiplatelet                            agents. ASA Grade Assessment: II - A patient with                            mild systemic disease. After reviewing the risks                            and benefits, the patient was deemed in                            satisfactory condition to undergo the procedure.  After obtaining informed consent, the endoscope was                            passed under direct vision. Throughout the                            procedure, the patient's blood pressure, pulse, and                            oxygen saturations were monitored continuously. The                            Model GIF-HQ190 [id number]) scope was introduced                            through the mouth, and advanced to the second  part                            of duodenum. The upper GI endoscopy was                            accomplished without difficulty. The patient                            tolerated the procedure well. Scope In: Scope Out: Findings:                 The esophagus was normal with the exception of mild                            narrowing (ring) at GEJ. No Barrett's.                           The stomach was normal. Hiatal hernia present.                           The examined duodenum was normal.                           The cardia and gastric fundus were normal on                            retroflexion. Complications:            No immediate complications. Estimated Blood Loss:     Estimated blood loss: none. Impression:               - Essentially Normal EGD.                           - No specimens collected. Recommendation:           1. Relux precautions                           2. Continue current medications Andrew Reid N. Henrene Pastor, MD 06/22/2016 8:59:01 AM This report has been signed electronically.

## 2016-06-22 NOTE — Patient Instructions (Addendum)
   Handouts given: Diverticulosis, Hemorrhoids  and GERD.   YOU HAD AN ENDOSCOPIC PROCEDURE TODAY AT [facility]:   Refer to the procedure report that was given to you for any specific questions about what was found during the examination.  If the procedure report does not answer your questions, please call your gastroenterologist to clarify.  If you requested that your care partner not be given the details of your procedure findings, then the procedure report has been included in a sealed envelope for you to review at your convenience later.  YOU SHOULD EXPECT: Some feelings of bloating in the abdomen. Passage of more gas than usual.  Walking can help get rid of the air that was put into your GI tract during the procedure and reduce the bloating. If you had a lower endoscopy (such as a colonoscopy or flexible sigmoidoscopy) you may notice spotting of blood in your stool or on the toilet paper. If you underwent a bowel prep for your procedure, you may not have a normal bowel movement for a few days.  Please Note:  You might notice some irritation and congestion in your nose or some drainage.  This is from the oxygen used during your procedure.  There is no need for concern and it should clear up in a day or so.  SYMPTOMS TO REPORT IMMEDIATELY:   Following lower endoscopy (colonoscopy or flexible sigmoidoscopy):  Excessive amounts of blood in the stool  Significant tenderness or worsening of abdominal pains  Swelling of the abdomen that is new, acute  Fever of 100F or higher   Following upper endoscopy (EGD)  Vomiting of blood or coffee ground material  New chest pain or pain under the shoulder blades  Painful or persistently difficult swallowing  New shortness of breath  Fever of 100F or higher  Black, tarry-looking stools  For urgent or emergent issues, a gastroenterologist can be reached at any hour by calling [phone].   DIET:  We do recommend a small meal  at first, but then you may proceed to your regular diet.  Drink plenty of fluids but you should avoid alcoholic beverages for 24 hours.  ACTIVITY:  You should plan to take it easy for the rest of today and you should NOT DRIVE or use heavy machinery until tomorrow (because of the sedation medicines used during the test).    FOLLOW UP: Our staff will call the number listed on your records the next business day following your procedure to check on you and address any questions or concerns that you may have regarding the information given to you following your procedure. If we do not reach you, we will leave a message.  However, if you are feeling well and you are not experiencing any problems, there is no need to return our call.  We will assume that you have returned to your regular daily activities without incident.  If any biopsies were taken you will be contacted by phone or by letter within the next 1-3 weeks.  Please call us at [phone] if you have not heard about the biopsies in 3 weeks.    SIGNATURES/CONFIDENTIALITY: You and/or your care partner have signed paperwork which will be entered into your electronic medical record.  These signatures attest to the fact that that the information above on your After Visit Summary has been reviewed and is understood.  Full responsibility of the confidentiality of this discharge information lies with you and/or your care-partner.

## 2016-06-22 NOTE — Progress Notes (Signed)
A/ox3 pleased with MAC, report to Karen RN 

## 2016-06-23 ENCOUNTER — Telehealth: Payer: Self-pay

## 2016-06-23 NOTE — Telephone Encounter (Signed)
    Follow up Call-  Call back number 06/22/2016  Post procedure Call Back phone  # [phone]  Permission to leave phone message No  Some recent data might be hidden     Patient questions:  Do you have a fever, pain , or abdominal swelling? No. Pain Score  0 *  Have you tolerated food without any problems? Yes.    Have you been able to return to your normal activities? Yes.    Do you have any questions about your discharge instructions: Diet   No. Medications  No. Follow up visit  No.  Do you have questions or concerns about your Care? No.  Actions: * If pain score is 4 or above: No action needed, pain <4.

## 2016-07-06 DIAGNOSIS — L4059 Other psoriatic arthropathy: Secondary | ICD-10-CM | POA: Diagnosis not present

## 2016-07-06 DIAGNOSIS — Z79899 Other long term (current) drug therapy: Secondary | ICD-10-CM | POA: Diagnosis not present

## 2016-07-25 DIAGNOSIS — M8580 Other specified disorders of bone density and structure, unspecified site: Secondary | ICD-10-CM | POA: Diagnosis not present

## 2016-07-25 DIAGNOSIS — E663 Overweight: Secondary | ICD-10-CM | POA: Diagnosis not present

## 2016-07-25 DIAGNOSIS — Z6828 Body mass index (BMI) 28.0-28.9, adult: Secondary | ICD-10-CM | POA: Diagnosis not present

## 2016-07-25 DIAGNOSIS — L4059 Other psoriatic arthropathy: Secondary | ICD-10-CM | POA: Diagnosis not present

## 2016-08-31 DIAGNOSIS — L4050 Arthropathic psoriasis, unspecified: Secondary | ICD-10-CM | POA: Diagnosis not present

## 2016-08-31 DIAGNOSIS — Z79899 Other long term (current) drug therapy: Secondary | ICD-10-CM | POA: Diagnosis not present

## 2016-10-26 DIAGNOSIS — Z79899 Other long term (current) drug therapy: Secondary | ICD-10-CM | POA: Diagnosis not present

## 2016-10-26 DIAGNOSIS — L4050 Arthropathic psoriasis, unspecified: Secondary | ICD-10-CM | POA: Diagnosis not present

## 2016-11-23 DIAGNOSIS — L4059 Other psoriatic arthropathy: Secondary | ICD-10-CM | POA: Diagnosis not present

## 2016-11-23 DIAGNOSIS — Z6828 Body mass index (BMI) 28.0-28.9, adult: Secondary | ICD-10-CM | POA: Diagnosis not present

## 2016-11-23 DIAGNOSIS — M65352 Trigger finger, left little finger: Secondary | ICD-10-CM | POA: Diagnosis not present

## 2016-11-23 DIAGNOSIS — M8580 Other specified disorders of bone density and structure, unspecified site: Secondary | ICD-10-CM | POA: Diagnosis not present

## 2016-11-23 DIAGNOSIS — E663 Overweight: Secondary | ICD-10-CM | POA: Diagnosis not present

## 2016-12-21 DIAGNOSIS — Z79899 Other long term (current) drug therapy: Secondary | ICD-10-CM | POA: Diagnosis not present

## 2016-12-21 DIAGNOSIS — L4050 Arthropathic psoriasis, unspecified: Secondary | ICD-10-CM | POA: Diagnosis not present

## 2017-02-15 DIAGNOSIS — L4059 Other psoriatic arthropathy: Secondary | ICD-10-CM | POA: Diagnosis not present

## 2017-02-15 DIAGNOSIS — Z79899 Other long term (current) drug therapy: Secondary | ICD-10-CM | POA: Diagnosis not present

## 2017-03-06 ENCOUNTER — Ambulatory Visit (INDEPENDENT_AMBULATORY_CARE_PROVIDER_SITE_OTHER): Payer: Medicare Other | Admitting: Family Medicine

## 2017-03-06 ENCOUNTER — Encounter: Payer: Self-pay | Admitting: Family Medicine

## 2017-03-06 VITALS — BP 120/80 | HR 67 | Temp 98.1°F | Ht 69.0 in | Wt 201.2 lb

## 2017-03-06 DIAGNOSIS — Z0000 Encounter for general adult medical examination without abnormal findings: Secondary | ICD-10-CM

## 2017-03-06 DIAGNOSIS — E785 Hyperlipidemia, unspecified: Secondary | ICD-10-CM | POA: Diagnosis not present

## 2017-03-06 DIAGNOSIS — I712 Thoracic aortic aneurysm, without rupture, unspecified: Secondary | ICD-10-CM

## 2017-03-06 DIAGNOSIS — L4050 Arthropathic psoriasis, unspecified: Secondary | ICD-10-CM

## 2017-03-06 DIAGNOSIS — Z23 Encounter for immunization: Secondary | ICD-10-CM

## 2017-03-06 LAB — LIPID PANEL
CHOL/HDL RATIO: 4
Cholesterol: 200 mg/dL (ref 0–200)
HDL: 45.3 mg/dL (ref >39.00)
LDL Cholesterol: 135 mg/dL — ABNORMAL HIGH (ref 0–99)
NONHDL: 155.19
Triglycerides: 100 mg/dL (ref 0.0–149.0)
VLDL: 20 mg/dL (ref 0.0–40.0)

## 2017-03-06 NOTE — Progress Notes (Signed)
Subjective:     Patient ID: Andrew Reid, male   DOB: Oct 14, 1945, 71 y.o.   MRN: [id number]  HPI Patient seen for Medicare subsequent annual wellness exam and medical follow-up. He has history of hyperlipidemia, thoracic aortic aneurysm, psoriatic arthritis followed by rheumatology. He also has history of hemophagocytic lymphohistiocytosis several years ago.Marland Kitchen  Has not had previous shingles vaccine. He of course could not take previous previous vaccine because of his immunosuppressive therapy. Needs flu vaccine. Colonoscopy up-to-date. Last echocardiogram 2 years ago.  No chest pain.  No dyspnea. Never smoked. Rare alcohol. Golfs about 2 days per week.  Past Medical History:  Diagnosis Date  . Arthritis   . Dysuria 12/10/2008  . Fever, unspecified 03/23/11  . HERPES ZOSTER 12/15/2008  . Pain in limb   . Psoriatic arthritis ([place])   . Unspecified hemorrhoids without mention of complication    Past Surgical History:  Procedure Laterality Date  . NO PAST SURGERIES  lymph biopsy  . VIDEO BRONCHOSCOPY  04/20/2011   Procedure: VIDEO BRONCHOSCOPY;  Surgeon: Pierre Bali, MD;  Location: [facility];  Service: Thoracic;  Laterality: N/A;  . VIDEO MEDIASTINOSCOPY  04/20/2011   Procedure: VIDEO MEDIASTINOSCOPY;  Surgeon: Pierre Bali, MD;  Location: [facility];  Service: Thoracic;  Laterality: N/A;    reports that he has never smoked. He has never used smokeless tobacco. He reports that he drinks alcohol. He reports that he does not use drugs. family history includes Breast cancer in his mother; Cancer in his father. Allergies  Allergen Reactions  . Ciprofloxacin Hcl Hives    Loss of appetite also..  . Penicillins     Other reaction(s): RASH  . Amoxicillin-Pot Clavulanate Rash   1.  Risk factors based on Past Medical , Social, and Family history reviewed and as indicated above with no changes 2.  Limitations in physical activities None.  No recent falls. 3.  Depression/mood No active depression or  anxiety issues 4.  Hearing No defiits 5.  ADLs independent in all. 6.  Cognitive function (orientation to time and place, language, writing, speech,memory) no short or long term memory issues.  Language and judgement intact. 7.  Home Safety no issues 8.  Height, weight, and visual acuity.all stable. 9.  Counseling discussed Pros and cons of PSA testing 10. Recommendation of preventive services. Flu vaccine given. Consider new shingles vaccine. He wishes to discuss with rheumatologist first 61. Labs based on risk factors-lipid panel 12. Care Plan-as above 13. Other Providers Dr Henrene Pastor, GI Dr. Amil Amen, rheumatology 14. Written schedule of screening/prevention services given to patient.   Review of Systems  Constitutional: Negative for activity change, appetite change, fatigue and fever.  HENT: Negative for congestion, ear pain and trouble swallowing.   Eyes: Negative for pain and visual disturbance.  Respiratory: Negative for cough, shortness of breath and wheezing.   Cardiovascular: Negative for chest pain and palpitations.  Gastrointestinal: Negative for abdominal distention, abdominal pain, blood in stool, constipation, diarrhea, nausea, rectal pain and vomiting.  Genitourinary: Negative for dysuria, hematuria and testicular pain.  Musculoskeletal: Negative for arthralgias and joint swelling.  Skin: Negative for rash.  Neurological: Negative for dizziness, syncope and headaches.  Hematological: Negative for adenopathy.  Psychiatric/Behavioral: Negative for confusion and dysphoric mood.       Objective:   Physical Exam  Constitutional: He is oriented to person, place, and time. He appears well-developed and well-nourished. No distress.  HENT:  Head: Normocephalic and atraumatic.  Right Ear: External ear normal.  Left Ear: External ear normal.  Mouth/Throat: Oropharynx is clear and moist.  Eyes: Pupils are equal, round, and reactive to light. Conjunctivae and EOM are normal.   Neck: Normal range of motion. Neck supple. No thyromegaly present.  Cardiovascular: Normal rate, regular rhythm and normal heart sounds.   No murmur heard. Pulmonary/Chest: No respiratory distress. He has no wheezes. He has no rales.  Abdominal: Soft. Bowel sounds are normal. He exhibits no distension and no mass. There is no tenderness. There is no rebound and no guarding.  Musculoskeletal: He exhibits no edema.  Lymphadenopathy:    He has no cervical adenopathy.  Neurological: He is alert and oriented to person, place, and time. He displays normal reflexes. No cranial nerve deficit.  Skin: No rash noted.  Psychiatric: He has a normal mood and affect.       Assessment:     #1 hyperlipidemia. No family history of premature CAD  #2 history of GERD and only takes intermittent Nexium currently  #3 psoriatic arthritis  #4 history of thoracic aortic aneurysm  #5 Medicare subsequent annual wellness visit. Needs flu vaccine.    Plan:     -Check lipid panel -Flu vaccine given -Set up repeat echocardiogram and consider referral to cardiology for ongoing surveillance.   -The natural history of prostate cancer and ongoing controversy regarding screening and potential treatment outcomes of prostate cancer has been discussed with the patient. The meaning of a false positive PSA and a false negative PSA has been discussed. He indicates understanding of the limitations of this screening test and wishes not to proceed with screening PSA testing. -He will discuss new shingles vaccine with rheumatologist and let us know if interested  Eulas Post MD [facility]

## 2017-03-06 NOTE — Patient Instructions (Signed)
Consider new shingles vaccine- Shingrix We will set up repeat echocardiogram to access thoracic aneurysm.

## 2017-03-13 ENCOUNTER — Ambulatory Visit (HOSPITAL_COMMUNITY): Payer: Medicare Other | Attending: Cardiology

## 2017-03-13 ENCOUNTER — Other Ambulatory Visit: Payer: Self-pay

## 2017-03-13 DIAGNOSIS — I517 Cardiomegaly: Secondary | ICD-10-CM | POA: Insufficient documentation

## 2017-03-13 DIAGNOSIS — E785 Hyperlipidemia, unspecified: Secondary | ICD-10-CM | POA: Insufficient documentation

## 2017-03-13 DIAGNOSIS — I712 Thoracic aortic aneurysm, without rupture, unspecified: Secondary | ICD-10-CM

## 2017-03-29 DIAGNOSIS — M8580 Other specified disorders of bone density and structure, unspecified site: Secondary | ICD-10-CM | POA: Diagnosis not present

## 2017-03-29 DIAGNOSIS — Z6829 Body mass index (BMI) 29.0-29.9, adult: Secondary | ICD-10-CM | POA: Diagnosis not present

## 2017-03-29 DIAGNOSIS — E663 Overweight: Secondary | ICD-10-CM | POA: Diagnosis not present

## 2017-03-29 DIAGNOSIS — M65352 Trigger finger, left little finger: Secondary | ICD-10-CM | POA: Diagnosis not present

## 2017-03-29 DIAGNOSIS — L4059 Other psoriatic arthropathy: Secondary | ICD-10-CM | POA: Diagnosis not present

## 2017-04-12 DIAGNOSIS — Z79899 Other long term (current) drug therapy: Secondary | ICD-10-CM | POA: Diagnosis not present

## 2017-04-12 DIAGNOSIS — L4050 Arthropathic psoriasis, unspecified: Secondary | ICD-10-CM | POA: Diagnosis not present

## 2017-06-07 DIAGNOSIS — Z79899 Other long term (current) drug therapy: Secondary | ICD-10-CM | POA: Diagnosis not present

## 2017-06-07 DIAGNOSIS — L4050 Arthropathic psoriasis, unspecified: Secondary | ICD-10-CM | POA: Diagnosis not present

## 2017-08-02 DIAGNOSIS — L4059 Other psoriatic arthropathy: Secondary | ICD-10-CM | POA: Diagnosis not present

## 2017-08-02 DIAGNOSIS — L4050 Arthropathic psoriasis, unspecified: Secondary | ICD-10-CM | POA: Diagnosis not present

## 2017-08-02 DIAGNOSIS — Z79899 Other long term (current) drug therapy: Secondary | ICD-10-CM | POA: Diagnosis not present

## 2017-08-23 DIAGNOSIS — M8580 Other specified disorders of bone density and structure, unspecified site: Secondary | ICD-10-CM | POA: Diagnosis not present

## 2017-08-23 DIAGNOSIS — M65352 Trigger finger, left little finger: Secondary | ICD-10-CM | POA: Diagnosis not present

## 2017-08-23 DIAGNOSIS — Z6828 Body mass index (BMI) 28.0-28.9, adult: Secondary | ICD-10-CM | POA: Diagnosis not present

## 2017-08-23 DIAGNOSIS — E663 Overweight: Secondary | ICD-10-CM | POA: Diagnosis not present

## 2017-08-23 DIAGNOSIS — L4059 Other psoriatic arthropathy: Secondary | ICD-10-CM | POA: Diagnosis not present

## 2017-09-27 DIAGNOSIS — L4050 Arthropathic psoriasis, unspecified: Secondary | ICD-10-CM | POA: Diagnosis not present

## 2017-09-27 DIAGNOSIS — Z79899 Other long term (current) drug therapy: Secondary | ICD-10-CM | POA: Diagnosis not present

## 2017-11-20 DIAGNOSIS — Z79899 Other long term (current) drug therapy: Secondary | ICD-10-CM | POA: Diagnosis not present

## 2017-11-20 DIAGNOSIS — L4050 Arthropathic psoriasis, unspecified: Secondary | ICD-10-CM | POA: Diagnosis not present

## 2017-12-20 DIAGNOSIS — Z6828 Body mass index (BMI) 28.0-28.9, adult: Secondary | ICD-10-CM | POA: Diagnosis not present

## 2017-12-20 DIAGNOSIS — M65352 Trigger finger, left little finger: Secondary | ICD-10-CM | POA: Diagnosis not present

## 2017-12-20 DIAGNOSIS — L4059 Other psoriatic arthropathy: Secondary | ICD-10-CM | POA: Diagnosis not present

## 2017-12-20 DIAGNOSIS — M8580 Other specified disorders of bone density and structure, unspecified site: Secondary | ICD-10-CM | POA: Diagnosis not present

## 2017-12-20 DIAGNOSIS — E663 Overweight: Secondary | ICD-10-CM | POA: Diagnosis not present

## 2018-01-15 DIAGNOSIS — L4050 Arthropathic psoriasis, unspecified: Secondary | ICD-10-CM | POA: Diagnosis not present

## 2018-01-15 DIAGNOSIS — L4059 Other psoriatic arthropathy: Secondary | ICD-10-CM | POA: Diagnosis not present

## 2018-03-14 DIAGNOSIS — L4059 Other psoriatic arthropathy: Secondary | ICD-10-CM | POA: Diagnosis not present

## 2018-03-14 DIAGNOSIS — L4050 Arthropathic psoriasis, unspecified: Secondary | ICD-10-CM | POA: Diagnosis not present

## 2018-04-09 DIAGNOSIS — Z23 Encounter for immunization: Secondary | ICD-10-CM | POA: Diagnosis not present

## 2018-05-14 DIAGNOSIS — L4050 Arthropathic psoriasis, unspecified: Secondary | ICD-10-CM | POA: Diagnosis not present

## 2018-05-14 DIAGNOSIS — Z79899 Other long term (current) drug therapy: Secondary | ICD-10-CM | POA: Diagnosis not present

## 2018-06-03 DIAGNOSIS — J209 Acute bronchitis, unspecified: Secondary | ICD-10-CM | POA: Diagnosis not present

## 2018-06-03 DIAGNOSIS — R05 Cough: Secondary | ICD-10-CM | POA: Diagnosis not present

## 2018-06-07 ENCOUNTER — Encounter: Payer: Self-pay | Admitting: Family Medicine

## 2018-06-07 ENCOUNTER — Other Ambulatory Visit: Payer: Self-pay

## 2018-06-07 ENCOUNTER — Ambulatory Visit (INDEPENDENT_AMBULATORY_CARE_PROVIDER_SITE_OTHER): Payer: Medicare Other | Admitting: Family Medicine

## 2018-06-07 VITALS — BP 112/82 | HR 82 | Temp 97.7°F | Ht 69.0 in | Wt 201.0 lb

## 2018-06-07 DIAGNOSIS — R319 Hematuria, unspecified: Secondary | ICD-10-CM

## 2018-06-07 DIAGNOSIS — R109 Unspecified abdominal pain: Secondary | ICD-10-CM

## 2018-06-07 LAB — POCT URINALYSIS DIPSTICK
Bilirubin, UA: NEGATIVE
GLUCOSE UA: NEGATIVE
Ketones, UA: NEGATIVE
LEUKOCYTES UA: NEGATIVE
Nitrite, UA: NEGATIVE
Protein, UA: NEGATIVE
RBC UA: POSITIVE
SPEC GRAV UA: 1.015 (ref 1.010–1.025)
Urobilinogen, UA: 0.2 E.U./dL
pH, UA: 6 (ref 5.0–8.0)

## 2018-06-07 MED ORDER — ONDANSETRON 8 MG PO TBDP
8.0000 mg | ORAL_TABLET | Freq: Three times a day (TID) | ORAL | 0 refills | Status: DC | PRN
Start: 2018-06-07 — End: 2020-08-02

## 2018-06-07 MED ORDER — TAMSULOSIN HCL 0.4 MG PO CAPS: 0.4000 mg | ORAL_CAPSULE | Freq: Every day | ORAL | 0 refills | Status: DC

## 2018-06-07 MED ORDER — HYDROCODONE-ACETAMINOPHEN 5-325 MG PO TABS: 1.0000 | ORAL_TABLET | Freq: Four times a day (QID) | ORAL | 0 refills | Status: DC | PRN

## 2018-06-07 NOTE — Progress Notes (Signed)
Subjective:     Patient ID: Andrew Reid, male   DOB: [birth year], 72 y.o.   MRN: [id number]  HPI Patient is seen following episode of flank pain yesterday morning.  He had about 10 minutes of intense pain right lower flank area he described this as sharp and without radiation.  He took some Mobic and after 10 minutes his pain was completely relieved.  There was no relation of pain to moving.  No abdominal pain.  Patient was doing well then until around 11:15 last night.  He had recurrence of severe right flank pain associate with some nausea and vomiting.  He again took another meloxicam after about 3 hours his pain stopped.  He rated his pain 9 out of 10.  Not any fevers or chills.  No gross hematuria.  No prior history of kidney stones.  He had no pain whatsoever today.  Past Medical History:  Diagnosis Date  . Arthritis   . Dysuria 12/10/2008  . Fever, unspecified 03/23/11  . HERPES ZOSTER 12/15/2008  . Pain in limb   . Psoriatic arthritis ([place])   . Unspecified hemorrhoids without mention of complication    Past Surgical History:  Procedure Laterality Date  . NO PAST SURGERIES  lymph biopsy  . VIDEO BRONCHOSCOPY  04/20/2011   Procedure: VIDEO BRONCHOSCOPY;  Surgeon: Pierre Bali, MD;  Location: [place];  Service: Thoracic;  Laterality: N/A;  . VIDEO MEDIASTINOSCOPY  04/20/2011   Procedure: VIDEO MEDIASTINOSCOPY;  Surgeon: Pierre Bali, MD;  Location: [place];  Service: Thoracic;  Laterality: N/A;    reports that he has never smoked. He has never used smokeless tobacco. He reports current alcohol use. He reports that he does not use drugs. family history includes Breast cancer in his mother; Cancer in his father. Allergies  Allergen Reactions  . Ciprofloxacin Hcl Hives    Loss of appetite also..  . Penicillins     Other reaction(s): RASH  . Amoxicillin-Pot Clavulanate Rash     Review of Systems  Constitutional: Negative for chills and fever.  Respiratory: Negative for cough.    Gastrointestinal: Negative for abdominal pain.  Genitourinary: Positive for flank pain. Negative for difficulty urinating and hematuria.       Objective:   Physical Exam Constitutional:      Appearance: Normal appearance.  Cardiovascular:     Rate and Rhythm: Normal rate and regular rhythm.  Pulmonary:     Effort: Pulmonary effort is normal.     Breath sounds: Normal breath sounds.  Abdominal:     General: Bowel sounds are normal.     Palpations: Abdomen is soft. There is no mass.     Tenderness: There is no abdominal tenderness. There is no guarding or rebound.  Neurological:     Mental Status: He is alert.        Assessment:     Patient presents with 2 episodes of severe right flank pain but none now for the past day.  Given that his symptoms were sudden onset and not associated with things such as movement highly suggest likely kidney stone.  Also, dipstick today shows 3+ blood but no leukocytes.    Plan:     -Stay very well-hydrated -Flomax 0.4 mg 1 p.o. nightly -Wrote for limited Zofran 8 mg ODT to take 1 every 8 hours as needed for any recurrent nausea -Wrote for limited Vicodin 5 mg 1-2 every 6 hours as needed for any recurrent pain -CT renal stone study for  any persistent or recurrent pain.  He knows to go to ER over the weekend for any severe pain not relieved with pain medication as above  Eulas Post MD [facility]

## 2018-06-07 NOTE — Patient Instructions (Signed)

## 2018-06-20 DIAGNOSIS — M65352 Trigger finger, left little finger: Secondary | ICD-10-CM | POA: Diagnosis not present

## 2018-06-20 DIAGNOSIS — E663 Overweight: Secondary | ICD-10-CM | POA: Diagnosis not present

## 2018-06-20 DIAGNOSIS — M8580 Other specified disorders of bone density and structure, unspecified site: Secondary | ICD-10-CM | POA: Diagnosis not present

## 2018-06-20 DIAGNOSIS — M150 Primary generalized (osteo)arthritis: Secondary | ICD-10-CM | POA: Diagnosis not present

## 2018-06-20 DIAGNOSIS — L4059 Other psoriatic arthropathy: Secondary | ICD-10-CM | POA: Diagnosis not present

## 2018-06-20 DIAGNOSIS — Z6828 Body mass index (BMI) 28.0-28.9, adult: Secondary | ICD-10-CM | POA: Diagnosis not present

## 2018-06-29 ENCOUNTER — Other Ambulatory Visit: Payer: Self-pay | Admitting: Family Medicine

## 2018-07-11 DIAGNOSIS — Z79899 Other long term (current) drug therapy: Secondary | ICD-10-CM | POA: Diagnosis not present

## 2018-07-11 DIAGNOSIS — L4059 Other psoriatic arthropathy: Secondary | ICD-10-CM | POA: Diagnosis not present

## 2018-09-05 DIAGNOSIS — L4050 Arthropathic psoriasis, unspecified: Secondary | ICD-10-CM | POA: Diagnosis not present

## 2018-09-05 DIAGNOSIS — Z79899 Other long term (current) drug therapy: Secondary | ICD-10-CM | POA: Diagnosis not present

## 2018-09-05 DIAGNOSIS — L4059 Other psoriatic arthropathy: Secondary | ICD-10-CM | POA: Diagnosis not present

## 2018-10-31 DIAGNOSIS — Z79899 Other long term (current) drug therapy: Secondary | ICD-10-CM | POA: Diagnosis not present

## 2018-10-31 DIAGNOSIS — L4050 Arthropathic psoriasis, unspecified: Secondary | ICD-10-CM | POA: Diagnosis not present

## 2018-12-26 DIAGNOSIS — L4050 Arthropathic psoriasis, unspecified: Secondary | ICD-10-CM | POA: Diagnosis not present

## 2018-12-26 DIAGNOSIS — Z79899 Other long term (current) drug therapy: Secondary | ICD-10-CM | POA: Diagnosis not present

## 2018-12-31 DIAGNOSIS — M150 Primary generalized (osteo)arthritis: Secondary | ICD-10-CM | POA: Diagnosis not present

## 2018-12-31 DIAGNOSIS — M8580 Other specified disorders of bone density and structure, unspecified site: Secondary | ICD-10-CM | POA: Diagnosis not present

## 2018-12-31 DIAGNOSIS — E663 Overweight: Secondary | ICD-10-CM | POA: Diagnosis not present

## 2018-12-31 DIAGNOSIS — L4059 Other psoriatic arthropathy: Secondary | ICD-10-CM | POA: Diagnosis not present

## 2018-12-31 DIAGNOSIS — M65352 Trigger finger, left little finger: Secondary | ICD-10-CM | POA: Diagnosis not present

## 2019-01-13 ENCOUNTER — Other Ambulatory Visit: Payer: Self-pay

## 2019-02-20 DIAGNOSIS — Z79899 Other long term (current) drug therapy: Secondary | ICD-10-CM | POA: Diagnosis not present

## 2019-02-20 DIAGNOSIS — L4050 Arthropathic psoriasis, unspecified: Secondary | ICD-10-CM | POA: Diagnosis not present

## 2019-03-20 DIAGNOSIS — E663 Overweight: Secondary | ICD-10-CM | POA: Diagnosis not present

## 2019-03-20 DIAGNOSIS — L4059 Other psoriatic arthropathy: Secondary | ICD-10-CM | POA: Diagnosis not present

## 2019-03-20 DIAGNOSIS — M65352 Trigger finger, left little finger: Secondary | ICD-10-CM | POA: Diagnosis not present

## 2019-03-20 DIAGNOSIS — M150 Primary generalized (osteo)arthritis: Secondary | ICD-10-CM | POA: Diagnosis not present

## 2019-03-20 DIAGNOSIS — Z6828 Body mass index (BMI) 28.0-28.9, adult: Secondary | ICD-10-CM | POA: Diagnosis not present

## 2019-03-20 DIAGNOSIS — M8580 Other specified disorders of bone density and structure, unspecified site: Secondary | ICD-10-CM | POA: Diagnosis not present

## 2019-04-17 DIAGNOSIS — Z79899 Other long term (current) drug therapy: Secondary | ICD-10-CM | POA: Diagnosis not present

## 2019-04-17 DIAGNOSIS — L4050 Arthropathic psoriasis, unspecified: Secondary | ICD-10-CM | POA: Diagnosis not present

## 2019-04-29 ENCOUNTER — Encounter: Payer: Self-pay | Admitting: Family Medicine

## 2019-04-29 ENCOUNTER — Ambulatory Visit (INDEPENDENT_AMBULATORY_CARE_PROVIDER_SITE_OTHER): Payer: Medicare Other | Admitting: Family Medicine

## 2019-04-29 ENCOUNTER — Other Ambulatory Visit: Payer: Self-pay

## 2019-04-29 VITALS — BP 122/70 | HR 68 | Temp 97.1°F | Ht 68.5 in | Wt 194.2 lb

## 2019-04-29 DIAGNOSIS — N400 Enlarged prostate without lower urinary tract symptoms: Secondary | ICD-10-CM | POA: Diagnosis not present

## 2019-04-29 NOTE — Patient Instructions (Signed)

## 2019-04-29 NOTE — Progress Notes (Signed)
  Subjective:     Patient ID: Andrew Reid, male   DOB: October 08, 1945, 73 y.o.   MRN: [id number]  HPI Patient is here "wanting to have his prostate checked ".  He has had remote history of prostatitis couple times in the past.  Denies any burning with urination.  He does have occasional nocturia but usually about only once at night.  No burning with urination.  Occasional slow stream but for the most part he has good stream flow and feels he is emptying his bladder.  He had kidney stone last December and passed that after starting tamsulosin.  He has had no evidence for recurrent stones since that time.  No gross hematuria.  Last PSA 2015 normal.  He has some mild urgency  Past Medical History:  Diagnosis Date  . Arthritis   . Dysuria 12/10/2008  . Fever, unspecified 03/23/11  . HERPES ZOSTER 12/15/2008  . Pain in limb   . Psoriatic arthritis ([place])   . Unspecified hemorrhoids without mention of complication    Past Surgical History:  Procedure Laterality Date  . NO PAST SURGERIES  lymph biopsy  . VIDEO BRONCHOSCOPY  04/20/2011   Procedure: VIDEO BRONCHOSCOPY;  Surgeon: Pierre Bali, MD;  Location: [place];  Service: Thoracic;  Laterality: N/A;  . VIDEO MEDIASTINOSCOPY  04/20/2011   Procedure: VIDEO MEDIASTINOSCOPY;  Surgeon: Pierre Bali, MD;  Location: [place];  Service: Thoracic;  Laterality: N/A;    reports that he has never smoked. He has never used smokeless tobacco. He reports current alcohol use. He reports that he does not use drugs. family history includes Breast cancer in his mother; Cancer in his father. Allergies  Allergen Reactions  . Ciprofloxacin Hcl Hives    Loss of appetite also..  . Penicillins     Other reaction(s): RASH  . Amoxicillin-Pot Clavulanate Rash     Review of Systems  Constitutional: Negative for chills and fever.  Genitourinary: Positive for urgency. Negative for decreased urine volume, difficulty urinating, dysuria, flank pain and hematuria.        Objective:   Physical Exam Vitals signs reviewed.  Cardiovascular:     Rate and Rhythm: Normal rate and regular rhythm.  Pulmonary:     Effort: Pulmonary effort is normal.     Breath sounds: Normal breath sounds.  Genitourinary:    Comments: Rectal exam reveals only minimally enlarged prostate which is symmetric with no nodules and nontender       Assessment:     Mild symptoms of nocturia and rare episodes of mild slow stream.  Suspect BPH.  He probably has mixed picture with some mild urgency as well    Plan:     -We discussed avoiding anticholinergic medications as much as possible -Discussed options such as Flomax but at this point his symptoms are relatively mild he would rather observe. -Avoid late the use of caffeine and excessive fluids at night.  Follow-up for any progressive symptoms -Flu vaccine already given through his rheumatologist -Consider Medicare wellness visit later this year  Eulas Post MD [facility]

## 2019-06-19 DIAGNOSIS — Z79899 Other long term (current) drug therapy: Secondary | ICD-10-CM | POA: Diagnosis not present

## 2019-08-14 DIAGNOSIS — L4050 Arthropathic psoriasis, unspecified: Secondary | ICD-10-CM | POA: Diagnosis not present

## 2019-08-14 DIAGNOSIS — Z79899 Other long term (current) drug therapy: Secondary | ICD-10-CM | POA: Diagnosis not present

## 2019-09-18 DIAGNOSIS — Z6828 Body mass index (BMI) 28.0-28.9, adult: Secondary | ICD-10-CM | POA: Diagnosis not present

## 2019-09-18 DIAGNOSIS — M8580 Other specified disorders of bone density and structure, unspecified site: Secondary | ICD-10-CM | POA: Diagnosis not present

## 2019-09-18 DIAGNOSIS — M65352 Trigger finger, left little finger: Secondary | ICD-10-CM | POA: Diagnosis not present

## 2019-09-18 DIAGNOSIS — M150 Primary generalized (osteo)arthritis: Secondary | ICD-10-CM | POA: Diagnosis not present

## 2019-09-18 DIAGNOSIS — E663 Overweight: Secondary | ICD-10-CM | POA: Diagnosis not present

## 2019-09-18 DIAGNOSIS — L4059 Other psoriatic arthropathy: Secondary | ICD-10-CM | POA: Diagnosis not present

## 2019-10-09 DIAGNOSIS — L4050 Arthropathic psoriasis, unspecified: Secondary | ICD-10-CM | POA: Diagnosis not present

## 2019-10-09 DIAGNOSIS — Z79899 Other long term (current) drug therapy: Secondary | ICD-10-CM | POA: Diagnosis not present

## 2020-01-29 DIAGNOSIS — Z79899 Other long term (current) drug therapy: Secondary | ICD-10-CM | POA: Diagnosis not present

## 2020-01-29 DIAGNOSIS — L4050 Arthropathic psoriasis, unspecified: Secondary | ICD-10-CM | POA: Diagnosis not present

## 2020-03-18 DIAGNOSIS — Z23 Encounter for immunization: Secondary | ICD-10-CM | POA: Diagnosis not present

## 2020-03-25 DIAGNOSIS — M150 Primary generalized (osteo)arthritis: Secondary | ICD-10-CM | POA: Diagnosis not present

## 2020-03-25 DIAGNOSIS — M8580 Other specified disorders of bone density and structure, unspecified site: Secondary | ICD-10-CM | POA: Diagnosis not present

## 2020-03-25 DIAGNOSIS — Z23 Encounter for immunization: Secondary | ICD-10-CM | POA: Diagnosis not present

## 2020-03-25 DIAGNOSIS — M65352 Trigger finger, left little finger: Secondary | ICD-10-CM | POA: Diagnosis not present

## 2020-03-25 DIAGNOSIS — L4059 Other psoriatic arthropathy: Secondary | ICD-10-CM | POA: Diagnosis not present

## 2020-03-25 DIAGNOSIS — E663 Overweight: Secondary | ICD-10-CM | POA: Diagnosis not present

## 2020-03-25 DIAGNOSIS — M25551 Pain in right hip: Secondary | ICD-10-CM | POA: Diagnosis not present

## 2020-03-25 DIAGNOSIS — Z6828 Body mass index (BMI) 28.0-28.9, adult: Secondary | ICD-10-CM | POA: Diagnosis not present

## 2020-04-01 DIAGNOSIS — Z79899 Other long term (current) drug therapy: Secondary | ICD-10-CM | POA: Diagnosis not present

## 2020-04-01 DIAGNOSIS — L4050 Arthropathic psoriasis, unspecified: Secondary | ICD-10-CM | POA: Diagnosis not present

## 2020-05-27 DIAGNOSIS — L4050 Arthropathic psoriasis, unspecified: Secondary | ICD-10-CM | POA: Diagnosis not present

## 2020-05-27 DIAGNOSIS — Z79899 Other long term (current) drug therapy: Secondary | ICD-10-CM | POA: Diagnosis not present

## 2020-07-09 DIAGNOSIS — H2513 Age-related nuclear cataract, bilateral: Secondary | ICD-10-CM | POA: Diagnosis not present

## 2020-07-09 DIAGNOSIS — H40033 Anatomical narrow angle, bilateral: Secondary | ICD-10-CM | POA: Diagnosis not present

## 2020-07-12 DIAGNOSIS — X32XXXA Exposure to sunlight, initial encounter: Secondary | ICD-10-CM | POA: Diagnosis not present

## 2020-07-12 DIAGNOSIS — L570 Actinic keratosis: Secondary | ICD-10-CM | POA: Diagnosis not present

## 2020-07-22 DIAGNOSIS — Z79899 Other long term (current) drug therapy: Secondary | ICD-10-CM | POA: Diagnosis not present

## 2020-07-22 DIAGNOSIS — L4059 Other psoriatic arthropathy: Secondary | ICD-10-CM | POA: Diagnosis not present

## 2020-07-26 ENCOUNTER — Ambulatory Visit: Payer: Medicare Other | Admitting: Family Medicine

## 2020-07-30 ENCOUNTER — Other Ambulatory Visit: Payer: Self-pay

## 2020-08-02 ENCOUNTER — Encounter: Payer: Self-pay | Admitting: Family Medicine

## 2020-08-02 ENCOUNTER — Ambulatory Visit (INDEPENDENT_AMBULATORY_CARE_PROVIDER_SITE_OTHER): Payer: Medicare Other | Admitting: Family Medicine

## 2020-08-02 ENCOUNTER — Other Ambulatory Visit: Payer: Self-pay

## 2020-08-02 DIAGNOSIS — K219 Gastro-esophageal reflux disease without esophagitis: Secondary | ICD-10-CM | POA: Insufficient documentation

## 2020-08-02 NOTE — Patient Instructions (Signed)
Consider tapering off the Nexium- maybe going to one every other day  Consider over the counter Pepcid 20 mg or Zantac 150 mg once to twice daily as needed.

## 2020-08-02 NOTE — Progress Notes (Signed)
Established Patient Office Visit  Subjective:  Patient ID: Andrew Reid, male    DOB: [birth year]  Age: 75 y.o. MRN: [id number]  CC: No chief complaint on file.   HPI Andrew Reid presents for medical follow-up.  He has history of psoriatic arthritis and is followed by rheumatologist for that.  He is maintained on methotrexate, meloxicam, and Remicade.  Also taking folic acid.  He has history of GERD and takes Nexium 40 mg daily.  He is not sure how long he has been on this.  He has not had any recent GERD symptoms.  No dysphagia.  No known history of Barrett's esophagus or any other complications.  No history of known peptic ulcer disease.  Apparently does not take meloxicam regularly.  He has remote history of hemophagocytic lymphohistiocytosis.  There have been some question previously of aneurysm thoracic aorta but he states this was not confirmed on most recent imaging.  Past Medical History:  Diagnosis Date  . Arthritis   . Dysuria 12/10/2008  . Fever, unspecified 03/23/11  . HERPES ZOSTER 12/15/2008  . Pain in limb   . Psoriatic arthritis ([place])   . Unspecified hemorrhoids without mention of complication     Past Surgical History:  Procedure Laterality Date  . NO PAST SURGERIES  lymph biopsy  . VIDEO BRONCHOSCOPY  04/20/2011   Procedure: VIDEO BRONCHOSCOPY;  Surgeon: Pierre Bali, MD;  Location: [place];  Service: Thoracic;  Laterality: N/A;  . VIDEO MEDIASTINOSCOPY  04/20/2011   Procedure: VIDEO MEDIASTINOSCOPY;  Surgeon: Pierre Bali, MD;  Location: [facility] OR;  Service: Thoracic;  Laterality: N/A;    Family History  Problem Relation Age of Onset  . Breast cancer Mother   . Cancer Father        sternum?  Marland Kitchen Arthritis Neg Hx   . Colon cancer Neg Hx     Social History   Socioeconomic History  . Marital status: Married    Spouse name: Not on file  . Number of children: Not on file  . Years of education: Not on file  . Highest education level: Not on file   Occupational History  . Occupation: Retired Programme researcher, broadcasting/film/video  Tobacco Use  . Smoking status: Never Smoker  . Smokeless tobacco: Never Used  Vaping Use  . Vaping Use: Never used  Substance and Sexual Activity  . Alcohol use: Yes    Comment: rare  . Drug use: No  . Sexual activity: Not on file  Other Topics Concern  . Not on file  Social History Narrative  . Not on file   Social Determinants of Health   Financial Resource Strain: Not on file  Food Insecurity: Not on file  Transportation Needs: Not on file  Physical Activity: Not on file  Stress: Not on file  Social Connections: Not on file  Intimate Partner Violence: Not on file    Outpatient Medications Prior to Visit  Medication Sig Dispense Refill  . esomeprazole (NEXIUM) 40 MG capsule Take 40 mg by mouth daily at 12 noon.    . folic acid (FOLVITE) 1 MG tablet Take 1 mg by mouth daily.    Marland Kitchen inFLIXimab (REMICADE) 100 MG injection 100 mg.    . meloxicam (MOBIC) 15 MG tablet Take 15 mg by mouth daily.      . methotrexate (RHEUMATREX) 2.5 MG tablet Take 1.5 mg by mouth once a week. 6 tabs once weekly per Dr Amil Amen    . HYDROcodone-acetaminophen (NORCO/VICODIN) 5-325 MG tablet  Take 1-2 tablets by mouth every 6 (six) hours as needed for moderate pain. 20 tablet 0  . ondansetron (ZOFRAN ODT) 8 MG disintegrating tablet Take 1 tablet (8 mg total) by mouth every 8 (eight) hours as needed for nausea or vomiting. (Patient not taking: Reported on 04/29/2019) 10 tablet 0   Facility-Administered Medications Prior to Visit  Medication Dose Route Frequency Provider Last Rate Last Admin  . 0.9 %  sodium chloride infusion  500 mL Intravenous Continuous Irene Shipper, MD        Allergies  Allergen Reactions  . Ciprofloxacin Hcl Hives    Loss of appetite also..  . Penicillins     Other reaction(s): RASH  . Amoxicillin-Pot Clavulanate Rash    ROS Review of Systems  Constitutional: Negative for appetite change and unexpected weight  change.  HENT: Negative for trouble swallowing.   Respiratory: Negative for shortness of breath.   Cardiovascular: Negative for chest pain.      Objective:    Physical Exam Vitals reviewed.  Constitutional:      Appearance: Normal appearance.  Cardiovascular:     Rate and Rhythm: Normal rate and regular rhythm.  Pulmonary:     Effort: Pulmonary effort is normal.     Breath sounds: Normal breath sounds.  Abdominal:     Palpations: Abdomen is soft. There is no mass.     Tenderness: There is no abdominal tenderness.  Neurological:     Mental Status: He is alert.     BP 110/70   Pulse 65   Ht 5' 8.5" (1.74 m)   Wt 198 lb (89.8 kg)   SpO2 95%   BMI 29.67 kg/m  Wt Readings from Last 3 Encounters:  08/02/20 198 lb (89.8 kg)  04/29/19 194 lb 3.2 oz (88.1 kg)  06/07/18 201 lb (91.2 kg)     Health Maintenance Due  Topic Date Due  . TETANUS/TDAP  06/12/2017  . COVID-19 Vaccine (4 - Booster for Pfizer series) 07/13/2020    There are no preventive care reminders to display for this patient.  Lab Results  Component Value Date   TSH 2.19 03/28/2012   Lab Results  Component Value Date   WBC 5.1 03/28/2012   HGB 14.9 03/28/2012   HCT 43.8 03/28/2012   MCV 91.7 03/28/2012   PLT 228.0 03/28/2012   Lab Results  Component Value Date   NA 136 08/14/2013   K 4.1 08/14/2013   CO2 26 08/14/2013   GLUCOSE 73 08/14/2013   BUN 19 08/14/2013   CREATININE 0.9 08/14/2013   BILITOT 1.2 03/28/2012   ALKPHOS 58 03/28/2012   AST 25 03/28/2012   ALT 23 03/28/2012   PROT 7.2 03/28/2012   ALBUMIN 3.9 03/28/2012   CALCIUM 9.1 08/14/2013   GFR 84.85 08/14/2013   Lab Results  Component Value Date   CHOL 200 03/06/2017   Lab Results  Component Value Date   HDL 45.30 03/06/2017   Lab Results  Component Value Date   LDLCALC 135 (H) 03/06/2017   Lab Results  Component Value Date   TRIG 100.0 03/06/2017   Lab Results  Component Value Date   CHOLHDL 4 03/06/2017   No  results found for: HGBA1C    Assessment & Plan:   History of GERD.  Symptomatically stable.  We explained if is not taking the meloxicam regularly to consider possible tapering off Nexium.  Consider alternative such as over-the-counter Pepcid or Zantac for breakthrough GERD symptoms.  He is  getting his immunizations through rheumatology including flu vaccine and Pneumovax  No orders of the defined types were placed in this encounter.   Follow-up: No follow-ups on file.    Carolann Littler, MD

## 2020-09-23 DIAGNOSIS — L4059 Other psoriatic arthropathy: Secondary | ICD-10-CM | POA: Diagnosis not present

## 2020-09-23 DIAGNOSIS — L4050 Arthropathic psoriasis, unspecified: Secondary | ICD-10-CM | POA: Diagnosis not present

## 2020-09-23 DIAGNOSIS — Z79899 Other long term (current) drug therapy: Secondary | ICD-10-CM | POA: Diagnosis not present

## 2020-10-21 DIAGNOSIS — M150 Primary generalized (osteo)arthritis: Secondary | ICD-10-CM | POA: Diagnosis not present

## 2020-10-21 DIAGNOSIS — E663 Overweight: Secondary | ICD-10-CM | POA: Diagnosis not present

## 2020-10-21 DIAGNOSIS — M8580 Other specified disorders of bone density and structure, unspecified site: Secondary | ICD-10-CM | POA: Diagnosis not present

## 2020-10-21 DIAGNOSIS — M25551 Pain in right hip: Secondary | ICD-10-CM | POA: Diagnosis not present

## 2020-10-21 DIAGNOSIS — L4059 Other psoriatic arthropathy: Secondary | ICD-10-CM | POA: Diagnosis not present

## 2020-10-21 DIAGNOSIS — M65352 Trigger finger, left little finger: Secondary | ICD-10-CM | POA: Diagnosis not present

## 2020-10-21 DIAGNOSIS — Z6827 Body mass index (BMI) 27.0-27.9, adult: Secondary | ICD-10-CM | POA: Diagnosis not present

## 2020-11-06 DIAGNOSIS — Z23 Encounter for immunization: Secondary | ICD-10-CM | POA: Diagnosis not present

## 2020-11-18 DIAGNOSIS — R5383 Other fatigue: Secondary | ICD-10-CM | POA: Diagnosis not present

## 2020-11-18 DIAGNOSIS — Z79899 Other long term (current) drug therapy: Secondary | ICD-10-CM | POA: Diagnosis not present

## 2020-11-18 DIAGNOSIS — Z111 Encounter for screening for respiratory tuberculosis: Secondary | ICD-10-CM | POA: Diagnosis not present

## 2020-11-18 DIAGNOSIS — L4050 Arthropathic psoriasis, unspecified: Secondary | ICD-10-CM | POA: Diagnosis not present

## 2020-11-18 DIAGNOSIS — L4059 Other psoriatic arthropathy: Secondary | ICD-10-CM | POA: Diagnosis not present

## 2021-01-13 DIAGNOSIS — L4059 Other psoriatic arthropathy: Secondary | ICD-10-CM | POA: Diagnosis not present

## 2021-03-10 DIAGNOSIS — L4059 Other psoriatic arthropathy: Secondary | ICD-10-CM | POA: Diagnosis not present

## 2021-03-10 DIAGNOSIS — L4050 Arthropathic psoriasis, unspecified: Secondary | ICD-10-CM | POA: Diagnosis not present

## 2021-03-10 DIAGNOSIS — Z79899 Other long term (current) drug therapy: Secondary | ICD-10-CM | POA: Diagnosis not present

## 2021-03-24 DIAGNOSIS — Z23 Encounter for immunization: Secondary | ICD-10-CM | POA: Diagnosis not present

## 2021-04-28 DIAGNOSIS — M8580 Other specified disorders of bone density and structure, unspecified site: Secondary | ICD-10-CM | POA: Diagnosis not present

## 2021-04-28 DIAGNOSIS — M150 Primary generalized (osteo)arthritis: Secondary | ICD-10-CM | POA: Diagnosis not present

## 2021-04-28 DIAGNOSIS — Z6828 Body mass index (BMI) 28.0-28.9, adult: Secondary | ICD-10-CM | POA: Diagnosis not present

## 2021-04-28 DIAGNOSIS — M65352 Trigger finger, left little finger: Secondary | ICD-10-CM | POA: Diagnosis not present

## 2021-04-28 DIAGNOSIS — E663 Overweight: Secondary | ICD-10-CM | POA: Diagnosis not present

## 2021-04-28 DIAGNOSIS — L4059 Other psoriatic arthropathy: Secondary | ICD-10-CM | POA: Diagnosis not present

## 2021-04-28 DIAGNOSIS — M25551 Pain in right hip: Secondary | ICD-10-CM | POA: Diagnosis not present

## 2021-05-12 DIAGNOSIS — Z79899 Other long term (current) drug therapy: Secondary | ICD-10-CM | POA: Diagnosis not present

## 2021-05-12 DIAGNOSIS — L4059 Other psoriatic arthropathy: Secondary | ICD-10-CM | POA: Diagnosis not present

## 2021-05-26 DIAGNOSIS — M150 Primary generalized (osteo)arthritis: Secondary | ICD-10-CM | POA: Diagnosis not present

## 2021-05-26 DIAGNOSIS — M65352 Trigger finger, left little finger: Secondary | ICD-10-CM | POA: Diagnosis not present

## 2021-05-26 DIAGNOSIS — L4059 Other psoriatic arthropathy: Secondary | ICD-10-CM | POA: Diagnosis not present

## 2021-05-26 DIAGNOSIS — M25551 Pain in right hip: Secondary | ICD-10-CM | POA: Diagnosis not present

## 2021-05-26 DIAGNOSIS — M8580 Other specified disorders of bone density and structure, unspecified site: Secondary | ICD-10-CM | POA: Diagnosis not present

## 2021-05-26 DIAGNOSIS — M79605 Pain in left leg: Secondary | ICD-10-CM | POA: Diagnosis not present

## 2021-05-26 DIAGNOSIS — E663 Overweight: Secondary | ICD-10-CM | POA: Diagnosis not present

## 2021-05-26 DIAGNOSIS — Z6828 Body mass index (BMI) 28.0-28.9, adult: Secondary | ICD-10-CM | POA: Diagnosis not present

## 2021-06-07 DIAGNOSIS — S76912A Strain of unspecified muscles, fascia and tendons at thigh level, left thigh, initial encounter: Secondary | ICD-10-CM | POA: Diagnosis not present

## 2021-06-07 DIAGNOSIS — M79605 Pain in left leg: Secondary | ICD-10-CM | POA: Diagnosis not present

## 2021-06-07 DIAGNOSIS — M79652 Pain in left thigh: Secondary | ICD-10-CM | POA: Diagnosis not present

## 2021-06-16 DIAGNOSIS — Z23 Encounter for immunization: Secondary | ICD-10-CM | POA: Diagnosis not present

## 2021-06-20 DIAGNOSIS — M79605 Pain in left leg: Secondary | ICD-10-CM | POA: Diagnosis not present

## 2021-06-20 DIAGNOSIS — M79652 Pain in left thigh: Secondary | ICD-10-CM | POA: Diagnosis not present

## 2021-06-28 DIAGNOSIS — M79652 Pain in left thigh: Secondary | ICD-10-CM | POA: Diagnosis not present

## 2021-06-28 DIAGNOSIS — M7632 Iliotibial band syndrome, left leg: Secondary | ICD-10-CM | POA: Diagnosis not present

## 2021-07-05 ENCOUNTER — Other Ambulatory Visit: Payer: Self-pay

## 2021-07-05 ENCOUNTER — Encounter: Payer: Self-pay | Admitting: Physical Therapy

## 2021-07-05 ENCOUNTER — Ambulatory Visit: Payer: Medicare Other | Attending: Orthopedic Surgery | Admitting: Physical Therapy

## 2021-07-05 DIAGNOSIS — M25562 Pain in left knee: Secondary | ICD-10-CM | POA: Diagnosis not present

## 2021-07-05 NOTE — Therapy (Signed)
[facility] [place], [place], [address] Phone: [phone]   Fax:  [phone]  Physical Therapy Evaluation  Patient Details  Name: Andrew Reid MRN: [id number] Date of Birth: 1946-03-10 Referring Provider (PT): Victorino December MD   Encounter Date: 07/05/2021   PT End of Session - 07/05/21 1413     Visit Number 1    Number of Visits 10    Date for PT Re-Evaluation 08/09/21    Authorization Type FOTO AT LEAST EVERY 5TH VISIT.  PROGRESS NOTE AT 10TH VISIT.  KX MODIFIER AFTER 15 VISITS.    PT Start Time 0100    PT Stop Time 0148    PT Time Calculation (min) 48 min    Activity Tolerance Patient tolerated treatment well    Behavior During Therapy [facility] for tasks assessed/performed             Past Medical History:  Diagnosis Date   Arthritis    Dysuria 12/10/2008   Fever, unspecified 03/23/11   HERPES ZOSTER 12/15/2008   Pain in limb    Psoriatic arthritis ([place])    Unspecified hemorrhoids without mention of complication     Past Surgical History:  Procedure Laterality Date   NO PAST SURGERIES  lymph biopsy   VIDEO BRONCHOSCOPY  04/20/2011   Procedure: VIDEO BRONCHOSCOPY;  Surgeon: Pierre Bali, MD;  Location: [place];  Service: Thoracic;  Laterality: N/A;   VIDEO MEDIASTINOSCOPY  04/20/2011   Procedure: VIDEO MEDIASTINOSCOPY;  Surgeon: Pierre Bali, MD;  Location: [place];  Service: Thoracic;  Laterality: N/A;    There were no vitals filed for this visit.    Subjective Assessment - 07/05/21 1331     Subjective COVID-19 screen performed prior to patient entering clinic.  The patient presenst to the clinic today with c/o left knee pain that came on around 05/24/21.  The pain seems to have correlated to a couple rounds of golf.  The patient was very bad initially.  He rates his pain at a 6/10.  His pain can wake him at night when he moves.  Meloxicam, ice pack and rest help decrease his pain.  Standing for long periods  of time can also increase his pain.    Pertinent History Psoriatic arthritis.    Limitations Standing    How long can you stand comfortably? Varies.    How long can you walk comfortably? Varies.    Patient Stated Goals Get out of pain.    Currently in Pain? Yes    Pain Score 6     Pain Location Knee    Pain Orientation Left;Lateral    Pain Descriptors / Indicators Sore    Pain Type Acute pain    Pain Onset More than a month ago    Aggravating Factors  See above.    Pain Relieving Factors See above.                [facility] [place] [facility] PT Assessment - 07/05/21 0001       Assessment   Medical Diagnosis ITB syndrome of left knee.    Referring Provider (PT) Victorino December MD    Onset Date/Surgical Date 05/24/21      Precautions   Precautions None      Restrictions   Weight Bearing Restrictions No      Balance Screen   Has the patient fallen in the past 6 months No    Has the patient had a decrease in activity level because  of a fear of falling?  No    Is the patient reluctant to leave their home because of a fear of falling?  No      Home Environment   Living Environment Private residence      Prior Function   Level of Independence Independent      Deep Tendon Reflexes   DTR Assessment Site Patella;Achilles    Patella DTR 2+    Achilles DTR 2+      ROM / Strength   AROM / PROM / Strength AROM;Strength      AROM   Overall AROM Comments Full left knee range of motion.      Strength   Overall Strength Comments Normal left knee and hip strength.      Palpation   Palpation comment Tender over left disal 1/2 of ITB to lateral knee.      Special Tests   Other special tests Normal left knee stability.      Ambulation/Gait   Gait Comments WNL.                        Objective measurements completed on examination: See above findings.       [facility] Adult PT Treatment/Exercise - 07/05/21 0001       Modalities   Modalities Electrical  Stimulation;Vasopneumatic      Electrical Stimulation   Electrical Stimulation Location Left distal ITB    Electrical Stimulation Action IFC at 80-150 Hz.    Electrical Stimulation Parameters 40% scan x 20 minutes.    Electrical Stimulation Goals Pain      Vasopneumatic   Number Minutes Vasopneumatic  20 minutes    Vasopnuematic Location  --   Left knee.   Vasopneumatic Pressure Low                          PT Long Term Goals - 07/05/21 1421       PT LONG TERM GOAL #1   Title Independent with a HEP.    Time 5    Period Weeks    Status New      PT LONG TERM GOAL #2   Title Sleep undisturbed.    Time 5    Period Weeks    Status New      PT LONG TERM GOAL #3   Title Perform ADL's with left knee pain not > 2/10.    Time 5    Period Weeks    Status New                    Plan - 07/05/21 1416     Clinical Impression Statement The patient presents to [facility] with c/o left ITB and left lateral knee pain that can began on 05/24/21.  The pain was very intense initially.  A regimen of Prednisone was quite helpful.  His left knee exhibits full range of motion and normal strength and stability.  He is tender to palpation from the distal 1/2 of his left ITB and especially tender at the level of the knee.  His sleep is disturbed and he cannot perform ADL's without pain.  Patient will benefit from skilled physical therapy intervention to address pain and deficits.    Personal Factors and Comorbidities Other    Examination-Activity Limitations Sleep;Locomotion Level;Other    Examination-Participation Restrictions Other    Stability/Clinical Decision Making Stable/Uncomplicated    Clinical Decision Making Low  Rehab Potential Excellent    PT Frequency 2x / week    PT Duration Other (comment)   5 weeks.   PT Treatment/Interventions ADLs/Self Care Home Management;Cryotherapy;Electrical Stimulation;Ultrasound;Moist Heat;Iontophoresis 4mg /ml  Dexamethasone;Functional mobility training;Therapeutic activities;Therapeutic exercise;Manual techniques;Patient/family education;Passive range of motion    PT Next Visit Plan Combo e'stim/US, STW/M, IASTM.  ITB stretching.    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 [place], [place], [address] Phone: [phone]   Fax:  [phone]  Name: Andrew Reid MRN: [id number] Date of Birth: 1945/08/31

## 2021-07-07 DIAGNOSIS — L4050 Arthropathic psoriasis, unspecified: Secondary | ICD-10-CM | POA: Diagnosis not present

## 2021-07-07 DIAGNOSIS — Z79899 Other long term (current) drug therapy: Secondary | ICD-10-CM | POA: Diagnosis not present

## 2021-07-07 DIAGNOSIS — L4059 Other psoriatic arthropathy: Secondary | ICD-10-CM | POA: Diagnosis not present

## 2021-07-08 ENCOUNTER — Encounter: Payer: Self-pay | Admitting: Physical Therapy

## 2021-07-08 ENCOUNTER — Other Ambulatory Visit: Payer: Self-pay

## 2021-07-08 ENCOUNTER — Ambulatory Visit: Payer: Medicare Other | Admitting: Physical Therapy

## 2021-07-08 DIAGNOSIS — M25562 Pain in left knee: Secondary | ICD-10-CM

## 2021-07-08 NOTE — Therapy (Signed)
[facility] [place], [place], [address] Phone: [phone]   Fax:  [phone]  Physical Therapy Treatment  Patient Details  Name: Andrew Reid MRN: [id number] Date of Birth: July 20, 1945 Referring Provider (PT): Victorino December MD   Encounter Date: 07/08/2021   PT End of Session - 07/08/21 1149     Visit Number 2    Number of Visits 10    Date for PT Re-Evaluation 08/09/21    Authorization Type FOTO AT LEAST EVERY 5TH VISIT.  PROGRESS NOTE AT 10TH VISIT.  KX MODIFIER AFTER 15 VISITS.    PT Start Time 0900    PT Stop Time 0953    PT Time Calculation (min) 53 min    Activity Tolerance Patient tolerated treatment well    Behavior During Therapy [facility] for tasks assessed/performed             Past Medical History:  Diagnosis Date   Arthritis    Dysuria 12/10/2008   Fever, unspecified 03/23/11   HERPES ZOSTER 12/15/2008   Pain in limb    Psoriatic arthritis ([place])    Unspecified hemorrhoids without mention of complication     Past Surgical History:  Procedure Laterality Date   NO PAST SURGERIES  lymph biopsy   VIDEO BRONCHOSCOPY  04/20/2011   Procedure: VIDEO BRONCHOSCOPY;  Surgeon: Pierre Bali, MD;  Location: [place];  Service: Thoracic;  Laterality: N/A;   VIDEO MEDIASTINOSCOPY  04/20/2011   Procedure: VIDEO MEDIASTINOSCOPY;  Surgeon: Pierre Bali, MD;  Location: [place];  Service: Thoracic;  Laterality: N/A;    There were no vitals filed for this visit.   Subjective Assessment - 07/08/21 1150     Subjective COVID-19 screen performed prior to patient entering clinic.  Patient did well after last treatment and slept better.    Pertinent History Psoriatic arthritis.    Limitations Standing    How long can you stand comfortably? Varies.    How long can you walk comfortably? Varies.    Currently in Pain? Yes    Pain Score 2     Pain Location Knee    Pain Orientation Left;Lateral    Pain Descriptors / Indicators  Sore    Pain Type Acute pain    Pain Onset More than a month ago                               [facility] Adult PT Treatment/Exercise - 07/08/21 0001       Modalities   Modalities Electrical Stimulation;Ultrasound;Vasopneumatic      Electrical Stimulation   Electrical Stimulation Location Left distal ITB    Electrical Stimulation Action IFC at 80-150 Hz.    Electrical Stimulation Parameters 40% scan x 20 minutes.    Electrical Stimulation Goals Pain      Ultrasound   Ultrasound Location Left distal ITB    Ultrasound Parameters Combo e'stim/US at 1.50 W/CM2 x 8 minutes.      Vasopneumatic   Number Minutes Vasopneumatic  20 minutes    Vasopnuematic Location  --   Left knee.   Vasopneumatic Pressure Low      Manual Therapy   Manual Therapy Soft tissue mobilization    Soft tissue mobilization STW/M including IASTM x 8 minutes to patient's left distal ITB.  PT Long Term Goals - 07/05/21 1421       PT LONG TERM GOAL #1   Title Independent with a HEP.    Time 5    Period Weeks    Status New      PT LONG TERM GOAL #2   Title Sleep undisturbed.    Time 5    Period Weeks    Status New      PT LONG TERM GOAL #3   Title Perform ADL's with left knee pain not > 2/10.    Time 5    Period Weeks    Status New                   Plan - 07/08/21 1155     Clinical Impression Statement The patient is responding very well to treatments and reportes a lower pain-level and sleeping better.    Personal Factors and Comorbidities Other    Examination-Activity Limitations Sleep;Locomotion Level;Other    Examination-Participation Restrictions Other    Stability/Clinical Decision Making Stable/Uncomplicated    Rehab Potential Excellent    PT Frequency 2x / week    PT Duration --   5 weeks.   PT Treatment/Interventions ADLs/Self Care Home Management;Cryotherapy;Electrical Stimulation;Ultrasound;Moist Heat;Iontophoresis  4mg /ml Dexamethasone;Functional mobility training;Therapeutic activities;Therapeutic exercise;Manual techniques;Patient/family education;Passive range of motion    PT Next Visit Plan Combo e'stim/US, STW/M, IASTM.  ITB stretching.    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 [place], [place], [address] Phone: [phone]   Fax:  [phone]  Name: Andrew Reid MRN: [id number] Date of Birth: 08-14-1945

## 2021-07-12 ENCOUNTER — Encounter: Payer: Self-pay | Admitting: Physical Therapy

## 2021-07-12 ENCOUNTER — Other Ambulatory Visit: Payer: Self-pay

## 2021-07-12 ENCOUNTER — Ambulatory Visit: Payer: Medicare Other | Admitting: Physical Therapy

## 2021-07-12 DIAGNOSIS — M25562 Pain in left knee: Secondary | ICD-10-CM | POA: Diagnosis not present

## 2021-07-12 NOTE — Therapy (Signed)
[facility] [place], [place], [address] Phone: [phone]   Fax:  [phone]  Physical Therapy Treatment  Patient Details  Name: Andrew Reid MRN: [id number] Date of Birth: 19-May-1946 Referring Provider (PT): Victorino December MD   Encounter Date: 07/12/2021   PT End of Session - 07/12/21 1110     Visit Number 3    Number of Visits 10    Date for PT Re-Evaluation 08/09/21    Authorization Type FOTO AT LEAST EVERY 5TH VISIT.  PROGRESS NOTE AT 10TH VISIT.  KX MODIFIER AFTER 15 VISITS.    PT Start Time 0900    PT Stop Time 0955    PT Time Calculation (min) 55 min    Activity Tolerance Patient tolerated treatment well    Behavior During Therapy [facility] for tasks assessed/performed             Past Medical History:  Diagnosis Date   Arthritis    Dysuria 12/10/2008   Fever, unspecified 03/23/11   HERPES ZOSTER 12/15/2008   Pain in limb    Psoriatic arthritis ([place])    Unspecified hemorrhoids without mention of complication     Past Surgical History:  Procedure Laterality Date   NO PAST SURGERIES  lymph biopsy   VIDEO BRONCHOSCOPY  04/20/2011   Procedure: VIDEO BRONCHOSCOPY;  Surgeon: Pierre Bali, MD;  Location: [place];  Service: Thoracic;  Laterality: N/A;   VIDEO MEDIASTINOSCOPY  04/20/2011   Procedure: VIDEO MEDIASTINOSCOPY;  Surgeon: Pierre Bali, MD;  Location: [place];  Service: Thoracic;  Laterality: N/A;    There were no vitals filed for this visit.   Subjective Assessment - 07/12/21 1110     Subjective COVID-19 screen performed prior to patient entering clinic.  Much better.    Pertinent History Psoriatic arthritis.    Limitations Standing    How long can you stand comfortably? Varies.    How long can you walk comfortably? Varies.    Patient Stated Goals Get out of pain.    Currently in Pain? Yes    Pain Score 1     Pain Location Knee    Pain Orientation Left;Lateral    Pain Descriptors / Indicators  Sore    Pain Type Acute pain    Pain Onset More than a month ago                               [facility] Adult PT Treatment/Exercise - 07/12/21 0001       Modalities   Modalities Electrical Stimulation;Ultrasound      Electrical Stimulation   Electrical Stimulation Location Lefrt distal ITB    Electrical Stimulation Action IFC at 80-150 Hz.    Electrical Stimulation Parameters 40% scan x 20 minutes.    Electrical Stimulation Goals Pain      Ultrasound   Ultrasound Location Left distal ITB    Ultrasound Parameters Combo e'stim/US at 1.50 W/CM x 12 minutes.      Manual Therapy   Manual Therapy Soft tissue mobilization    Soft tissue mobilization STW/M x 11 minutes to patient's left distal ITB                          PT Long Term Goals - 07/05/21 1421       PT LONG TERM GOAL #1   Title Independent with a HEP.  Time 5    Period Weeks    Status New      PT LONG TERM GOAL #2   Title Sleep undisturbed.    Time 5    Period Weeks    Status New      PT LONG TERM GOAL #3   Title Perform ADL's with left knee pain not > 2/10.    Time 5    Period Weeks    Status New                   Plan - 07/12/21 1137     Clinical Impression Statement No pain post-treatment.  Patient states he feels ready to trying golfing again.    Personal Factors and Comorbidities Other    Examination-Activity Limitations Sleep;Locomotion Level;Other    Examination-Participation Restrictions Other    Stability/Clinical Decision Making Stable/Uncomplicated    Rehab Potential Excellent    PT Frequency 2x / week    PT Treatment/Interventions ADLs/Self Care Home Management;Cryotherapy;Electrical Stimulation;Ultrasound;Moist Heat;Iontophoresis 4mg /ml Dexamethasone;Functional mobility training;Therapeutic activities;Therapeutic exercise;Manual techniques;Patient/family education;Passive range of motion    Consulted and Agree with Plan of Care Patient              Patient will benefit from skilled therapeutic intervention in order to improve the following deficits and impairments:  Pain, Decreased activity tolerance  Visit Diagnosis: Acute pain of left knee     Problem List Patient Active Problem List   Diagnosis Date Noted   GERD (gastroesophageal reflux disease) 08/02/2020   Hyperlipidemia 03/11/2015   Aneurysm of thoracic aorta 04/04/2012   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 [place], [place], [address] Phone: [phone]   Fax:  [phone]  Name: Andrew Reid MRN: [id number] Date of Birth: July 14, 1945

## 2021-07-14 ENCOUNTER — Other Ambulatory Visit: Payer: Self-pay

## 2021-07-14 ENCOUNTER — Encounter: Payer: Self-pay | Admitting: Physical Therapy

## 2021-07-14 ENCOUNTER — Ambulatory Visit: Payer: Medicare Other | Attending: Orthopedic Surgery | Admitting: Physical Therapy

## 2021-07-14 DIAGNOSIS — M25562 Pain in left knee: Secondary | ICD-10-CM | POA: Insufficient documentation

## 2021-07-14 NOTE — Therapy (Signed)
[facility] [place], [place], [address] Phone: [phone]   Fax:  [phone]  Physical Therapy Treatment  Patient Details  Name: Andrew Reid MRN: [id number] Date of Birth: 1946/01/17 Referring Provider (PT): Victorino December MD   Encounter Date: 07/14/2021   PT End of Session - 07/14/21 0900     Visit Number 4    Number of Visits 10    Date for PT Re-Evaluation 08/09/21    Authorization Type FOTO AT LEAST EVERY 5TH VISIT.  PROGRESS NOTE AT 10TH VISIT.  KX MODIFIER AFTER 15 VISITS.    PT Start Time 0904    PT Stop Time 0941    PT Time Calculation (min) 37 min    Activity Tolerance Patient tolerated treatment well    Behavior During Therapy [facility] for tasks assessed/performed             Past Medical History:  Diagnosis Date   Arthritis    Dysuria 12/10/2008   Fever, unspecified 03/23/11   HERPES ZOSTER 12/15/2008   Pain in limb    Psoriatic arthritis ([place])    Unspecified hemorrhoids without mention of complication     Past Surgical History:  Procedure Laterality Date   NO PAST SURGERIES  lymph biopsy   VIDEO BRONCHOSCOPY  04/20/2011   Procedure: VIDEO BRONCHOSCOPY;  Surgeon: Pierre Bali, MD;  Location: [place];  Service: Thoracic;  Laterality: N/A;   VIDEO MEDIASTINOSCOPY  04/20/2011   Procedure: VIDEO MEDIASTINOSCOPY;  Surgeon: Pierre Bali, MD;  Location: [place];  Service: Thoracic;  Laterality: N/A;    There were no vitals filed for this visit.   Subjective Assessment - 07/14/21 0858     Subjective COVID-19 screen performed prior to patient entering clinic.  Much better. Reports that he has felt normal all week but has some deep soreness of the mid to distal L ITB.    Pertinent History Psoriatic arthritis.    Limitations Standing    How long can you stand comfortably? Varies.    How long can you walk comfortably? Varies.    Patient Stated Goals Get out of pain.    Currently in Pain? No/denies                 [facility] PT Assessment - 07/14/21 0001       Assessment   Medical Diagnosis ITB syndrome of left knee.    Referring Provider (PT) Victorino December MD    Onset Date/Surgical Date 05/24/21      Precautions   Precautions None      Restrictions   Weight Bearing Restrictions No                           [facility] Adult PT Treatment/Exercise - 07/14/21 0001       Modalities   Modalities Electrical Stimulation;Ultrasound      Electrical Stimulation   Electrical Stimulation Location L distal ITB    Electrical Stimulation Action Pre-Mod    Electrical Stimulation Parameters 80-150 hz x10 min    Electrical Stimulation Goals Pain      Ultrasound   Ultrasound Location L distal ITB    Ultrasound Parameters Combo 1.5 w/cm2, 100%, 1 mhz x10 min    Ultrasound Goals Pain      Manual Therapy   Manual Therapy Soft tissue mobilization    Soft tissue mobilization iASTW to L distal ITB, lateral HS to reduce tone  PT Long Term Goals - 07/05/21 1421       PT LONG TERM GOAL #1   Title Independent with a HEP.    Time 5    Period Weeks    Status New      PT LONG TERM GOAL #2   Title Sleep undisturbed.    Time 5    Period Weeks    Status New      PT LONG TERM GOAL #3   Title Perform ADL's with left knee pain not > 2/10.    Time 5    Period Weeks    Status New                   Plan - 07/14/21 1053     Clinical Impression Statement Patient presented in clinic with reports of no notable pain but a deep palpable soreness. Patient may try going to the driving range next week to assess his progress. Patient educated that IASTW may cause more soreness. Appropriate redness response noted with IASTW. Normal modalities response noted following removal of the modalities.    Personal Factors and Comorbidities Other    Examination-Activity Limitations Sleep;Locomotion Level;Other    Examination-Participation Restrictions  Other    Stability/Clinical Decision Making Stable/Uncomplicated    Rehab Potential Excellent    PT Frequency 2x / week    PT Treatment/Interventions ADLs/Self Care Home Management;Cryotherapy;Electrical Stimulation;Ultrasound;Moist Heat;Iontophoresis 4mg /ml Dexamethasone;Functional mobility training;Therapeutic activities;Therapeutic exercise;Manual techniques;Patient/family education;Passive range of motion    PT Next Visit Plan Combo e'stim/US, STW/M, IASTM.  ITB stretching.    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 [place], [place], [address] Phone: [phone]   Fax:  [phone]  Name: Andrew Reid MRN: [id number] Date of Birth: [birth year]

## 2021-07-21 ENCOUNTER — Other Ambulatory Visit: Payer: Self-pay

## 2021-07-21 ENCOUNTER — Ambulatory Visit: Payer: Medicare Other | Admitting: Physical Therapy

## 2021-07-21 DIAGNOSIS — M25562 Pain in left knee: Secondary | ICD-10-CM

## 2021-07-21 NOTE — Therapy (Addendum)
[facility] [place], [place], [address] Phone: [phone]   Fax:  [phone]  Physical Therapy Treatment  Patient Details  Name: Andrew Reid MRN: [id number] Date of Birth: 02/13/1946 Referring Provider (PT): Victorino December MD   Encounter Date: 07/21/2021   PT End of Session - 07/21/21 0955     Visit Number 5    Number of Visits 10    Date for PT Re-Evaluation 08/09/21    Authorization Type FOTO AT LEAST EVERY 5TH VISIT.  PROGRESS NOTE AT 10TH VISIT.  KX MODIFIER AFTER 15 VISITS.    PT Start Time 0901    PT Stop Time 0953    PT Time Calculation (min) 52 min    Activity Tolerance Patient tolerated treatment well    Behavior During Therapy [facility] for tasks assessed/performed             Past Medical History:  Diagnosis Date   Arthritis    Dysuria 12/10/2008   Fever, unspecified 03/23/11   HERPES ZOSTER 12/15/2008   Pain in limb    Psoriatic arthritis ([place])    Unspecified hemorrhoids without mention of complication     Past Surgical History:  Procedure Laterality Date   NO PAST SURGERIES  lymph biopsy   VIDEO BRONCHOSCOPY  04/20/2011   Procedure: VIDEO BRONCHOSCOPY;  Surgeon: Pierre Bali, MD;  Location: [place];  Service: Thoracic;  Laterality: N/A;   VIDEO MEDIASTINOSCOPY  04/20/2011   Procedure: VIDEO MEDIASTINOSCOPY;  Surgeon: Pierre Bali, MD;  Location: [place];  Service: Thoracic;  Laterality: N/A;    There were no vitals filed for this visit.   Subjective Assessment - 07/21/21 0956     Subjective COVID-19 screen performed prior to patient entering clinic.  Very pleased with progress.    Pertinent History Psoriatic arthritis.    Limitations Standing    How long can you stand comfortably? Varies.    How long can you walk comfortably? Varies.    Patient Stated Goals Get out of pain.    Currently in Pain? Yes    Pain Score 1     Pain Location Knee    Pain Orientation Left;Lateral    Pain Descriptors  / Indicators Sore    Pain Type Acute pain    Pain Onset More than a month ago                               [facility] Adult PT Treatment/Exercise - 07/21/21 0001       Exercises   Exercises Knee/Hip      Knee/Hip Exercises: Aerobic   Recumbent Bike Level 3 x 11 minutes.      Modalities   Modalities Psychologist, educational Location Left distal ITB    Electrical Stimulation Action Pre-mod.    Electrical Stimulation Parameters 80-150 Hz x 20 minutes.    Electrical Stimulation Goals Pain      Manual Therapy   Manual Therapy Soft tissue mobilization    Soft tissue mobilization IASTX x 12 minutes to patient's left distal ITB                          PT Long Term Goals - 07/05/21 1421       PT LONG TERM GOAL #1   Title Independent with a HEP.  Time 5    Period Weeks    Status New      PT LONG TERM GOAL #2   Title Sleep undisturbed.    Time 5    Period Weeks    Status New      PT LONG TERM GOAL #3   Title Perform ADL's with left knee pain not > 2/10.    Time 5    Period Weeks    Status New                   Plan - 07/21/21 1026     Clinical Impression Statement Patient states he returned to golf and his knee did very well. No pain at conclusion of treatment today.  Patient requesting to be put on hold at this time.    Personal Factors and Comorbidities Other    Examination-Activity Limitations Sleep;Locomotion Level;Other    Examination-Participation Restrictions Other    Stability/Clinical Decision Making Stable/Uncomplicated    Rehab Potential Excellent    PT Frequency 2x / week    PT Treatment/Interventions ADLs/Self Care Home Management;Cryotherapy;Electrical Stimulation;Ultrasound;Moist Heat;Iontophoresis 4mg /ml Dexamethasone;Functional mobility training;Therapeutic activities;Therapeutic exercise;Manual techniques;Patient/family education;Passive  range of motion    PT Next Visit Plan Combo e'stim/US, STW/M, IASTM.  ITB stretching.    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 [place], [place], [address] Phone: [phone]   Fax:  [phone]  Name: Andrew Reid MRN: [id number] Date of Birth: 1945/08/25   PHYSICAL THERAPY DISCHARGE SUMMARY  Visits from Start of Care: 5.  Current functional level related to goals / functional outcomes: See above.   Remaining deficits: Minimal pain, pleased with progress and ready to discharge.   Education / Equipment: HEP.   Patient agrees to discharge. Patient goals were    . Patient is being discharged due to being pleased with the current functional level.     [place] Javaria Knapke MPT

## 2021-07-21 NOTE — Patient Instructions (Signed)
[facility] [place] Created by [place] Oryon Gary Feb 9th, 2023 View at [email or web address] using code: [id number] Total 3 Page 1 of 1 HIP ABDUCTION - SIDELYING While lying on your side, slowly raise up your top leg to the side. Keep your knee straight and maintain your toes pointed forward the entire time. Keep your leg in-line with your body. The bottom leg can be bent to stabilize your body. Repeat 15 Times Hold 2 Seconds Complete 2 Sets Perform 2 Times a Day SIDELYING CLAMSHELL - CLAM SHELL While lying on your side with your knees bent, draw up the top knee while keeping contact of your feet together. Do not let your pelvis roll back during the lifting movement. Repeat 20 Times Hold 2 Seconds Complete 2 Sets Perform 2 Times a Day [place]PINE HIP ABDUCTION - ELASTIC BAND CLAMS - CLAMSHELL Lie down on your back with your knees bent. Place an elastic band around your knees and then pull your knees apart. Repeat 20 Times Hold 3 Seconds Complete 2 Sets Perform 2 Times a Day

## 2021-09-01 DIAGNOSIS — Z79899 Other long term (current) drug therapy: Secondary | ICD-10-CM | POA: Diagnosis not present

## 2021-09-01 DIAGNOSIS — L4059 Other psoriatic arthropathy: Secondary | ICD-10-CM | POA: Diagnosis not present

## 2021-10-27 DIAGNOSIS — L4059 Other psoriatic arthropathy: Secondary | ICD-10-CM | POA: Diagnosis not present

## 2021-10-27 DIAGNOSIS — Z79899 Other long term (current) drug therapy: Secondary | ICD-10-CM | POA: Diagnosis not present

## 2021-11-18 DIAGNOSIS — M79605 Pain in left leg: Secondary | ICD-10-CM | POA: Diagnosis not present

## 2021-11-18 DIAGNOSIS — M25551 Pain in right hip: Secondary | ICD-10-CM | POA: Diagnosis not present

## 2021-11-18 DIAGNOSIS — M1991 Primary osteoarthritis, unspecified site: Secondary | ICD-10-CM | POA: Diagnosis not present

## 2021-11-18 DIAGNOSIS — M65352 Trigger finger, left little finger: Secondary | ICD-10-CM | POA: Diagnosis not present

## 2021-11-18 DIAGNOSIS — E663 Overweight: Secondary | ICD-10-CM | POA: Diagnosis not present

## 2021-11-18 DIAGNOSIS — M8580 Other specified disorders of bone density and structure, unspecified site: Secondary | ICD-10-CM | POA: Diagnosis not present

## 2021-11-18 DIAGNOSIS — L4059 Other psoriatic arthropathy: Secondary | ICD-10-CM | POA: Diagnosis not present

## 2021-11-18 DIAGNOSIS — Z6828 Body mass index (BMI) 28.0-28.9, adult: Secondary | ICD-10-CM | POA: Diagnosis not present

## 2021-12-22 DIAGNOSIS — L4050 Arthropathic psoriasis, unspecified: Secondary | ICD-10-CM | POA: Diagnosis not present

## 2021-12-22 DIAGNOSIS — R5383 Other fatigue: Secondary | ICD-10-CM | POA: Diagnosis not present

## 2021-12-22 DIAGNOSIS — L4059 Other psoriatic arthropathy: Secondary | ICD-10-CM | POA: Diagnosis not present

## 2021-12-22 DIAGNOSIS — Z79899 Other long term (current) drug therapy: Secondary | ICD-10-CM | POA: Diagnosis not present

## 2021-12-22 DIAGNOSIS — Z111 Encounter for screening for respiratory tuberculosis: Secondary | ICD-10-CM | POA: Diagnosis not present

## 2022-02-16 DIAGNOSIS — L4059 Other psoriatic arthropathy: Secondary | ICD-10-CM | POA: Diagnosis not present

## 2022-02-16 DIAGNOSIS — Z79899 Other long term (current) drug therapy: Secondary | ICD-10-CM | POA: Diagnosis not present

## 2022-03-31 DIAGNOSIS — Z23 Encounter for immunization: Secondary | ICD-10-CM | POA: Diagnosis not present

## 2022-04-13 DIAGNOSIS — L4059 Other psoriatic arthropathy: Secondary | ICD-10-CM | POA: Diagnosis not present

## 2022-04-13 DIAGNOSIS — L4050 Arthropathic psoriasis, unspecified: Secondary | ICD-10-CM | POA: Diagnosis not present

## 2022-04-13 DIAGNOSIS — Z79899 Other long term (current) drug therapy: Secondary | ICD-10-CM | POA: Diagnosis not present

## 2022-05-26 DIAGNOSIS — M1991 Primary osteoarthritis, unspecified site: Secondary | ICD-10-CM | POA: Diagnosis not present

## 2022-05-26 DIAGNOSIS — M8580 Other specified disorders of bone density and structure, unspecified site: Secondary | ICD-10-CM | POA: Diagnosis not present

## 2022-05-26 DIAGNOSIS — M79605 Pain in left leg: Secondary | ICD-10-CM | POA: Diagnosis not present

## 2022-05-26 DIAGNOSIS — E663 Overweight: Secondary | ICD-10-CM | POA: Diagnosis not present

## 2022-05-26 DIAGNOSIS — Z6827 Body mass index (BMI) 27.0-27.9, adult: Secondary | ICD-10-CM | POA: Diagnosis not present

## 2022-05-26 DIAGNOSIS — L4059 Other psoriatic arthropathy: Secondary | ICD-10-CM | POA: Diagnosis not present

## 2022-05-26 DIAGNOSIS — M65352 Trigger finger, left little finger: Secondary | ICD-10-CM | POA: Diagnosis not present

## 2022-05-26 DIAGNOSIS — M25551 Pain in right hip: Secondary | ICD-10-CM | POA: Diagnosis not present

## 2022-05-26 DIAGNOSIS — M25521 Pain in right elbow: Secondary | ICD-10-CM | POA: Diagnosis not present

## 2023-04-23 ENCOUNTER — Encounter: Payer: Self-pay | Admitting: Family Medicine

## 2023-04-23 ENCOUNTER — Ambulatory Visit: Payer: Medicare Other | Admitting: Family Medicine

## 2023-04-23 VITALS — BP 130/80 | HR 65 | Temp 97.9°F | Ht 68.11 in | Wt 194.5 lb

## 2023-04-23 DIAGNOSIS — Z23 Encounter for immunization: Secondary | ICD-10-CM | POA: Diagnosis not present

## 2023-04-23 DIAGNOSIS — Z0000 Encounter for general adult medical examination without abnormal findings: Secondary | ICD-10-CM | POA: Diagnosis not present

## 2023-04-23 NOTE — Patient Instructions (Signed)
Consider Shingles vaccine ("Shingrix")

## 2023-04-23 NOTE — Progress Notes (Signed)
Established Patient Office Visit  Subjective   Patient ID: Andrew Reid, male    DOB: 12-Feb-1946  Age: 77 y.o. MRN: [id number]  Chief Complaint  Patient presents with   Annual Exam    HPI   Andrew Reid is here for annual Medicare subsequent wellness visit.  He has history of GERD, psoriatic arthritis, past history of hemophagocytic lymphohistiocytosis-which was diagnosed at [facility] several years ago and stable.  He is followed by rheumatologist now for his psoriatic arthritis takes Remicade and methotrexate.  He gets CBC and comprehensive chemistries through rheumatology every 4 months.  1.  Risk factors based on Past Medical , Social, and Family history reviewed and as indicated above with no changes  Family History  Problem Relation Age of Onset   Breast cancer Mother    Cancer Father        sternum?   Arthritis Neg Hx    Colon cancer Neg Hx    -Father is still alive and lives independently and 101.  He had a maternal grandmother lived be 54.  Mom died age 53 of breast cancer.  He has a sister alive and well  Social History   Socioeconomic History   Marital status: Married    Spouse name: Not on file   Number of children: Not on file   Years of education: Not on file   Highest education level: Not on file  Occupational History   Occupation: Retired Psychologist, educational  Tobacco Use   Smoking status: Never   Smokeless tobacco: Never  Vaping Use   Vaping status: Never Used  Substance and Sexual Activity   Alcohol use: Yes    Comment: rare   Drug use: No   Sexual activity: Not on file  Other Topics Concern   Not on file  Social History Narrative   Not on file   Social Determinants of Health   Financial Resource Strain: Not on file  Food Insecurity: Not on file  Transportation Needs: Not on file  Physical Activity: Not on file  Stress: Not on file  Social Connections: Not on file  Intimate Partner Violence: Not on file  Enjoys playing golf still fairly  regularly. Never smoked. No regular alcohol.  2.  Limitations in physical activities None.  No recent falls.  Plays golf regularly.  No balance issues.    3.  Depression/mood No active depression or anxiety issues PHQ 2 equals 0  4.  Hearing No deficits  5.  ADLs independent in all.  6.  Cognitive function (orientation to time and place, language, writing, speech,memory) no short or long term memory issues.  Language and judgement intact.  7.  Home Safety no issues.  8.  Height, weight, and visual acuity.all stable.  Wt Readings from Last 3 Encounters:  04/23/23 194 lb 8 oz (88.2 kg)  08/02/20 198 lb (89.8 kg)  04/29/19 194 lb 3.2 oz (88.1 kg)    9.  Counseling discussed -Counseled regarding age and gender appropriate preventative screenings and immunizations.  10. Recommendation of preventive services.  Flu vaccine.  We will do so discussed possible Shingrix.  He did have clinical case of shingles several years ago.  11. Labs based on risk factors-none indicated at this time.  He gets regular CBC and c-Met per rheumatology  12. Care Plan-as below  13. Other Providers-Dr. Margo Aye, dermatology    Dr. Dierdre Forth, rheumatology  14. Written schedule of screening/prevention services given to patient. Health Maintenance  Topic Date Due  Zoster Vaccines- Shingrix (1 of 2) Never done   DTaP/Tdap/Td (2 - Tdap) 06/12/2017   INFLUENZA VACCINE  01/11/2023   COVID-19 Vaccine (4 - 2023-24 season) 02/11/2023   Medicare Annual Wellness (AWV)  04/22/2024   Pneumonia Vaccine 43+ Years old  Completed   Hepatitis C Screening  Completed   HPV VACCINES  Aged Out   Colonoscopy  Discontinued    Review of Systems  Constitutional:  Negative for chills, fever, malaise/fatigue and weight loss.  HENT:  Negative for hearing loss.   Eyes:  Negative for blurred vision and double vision.  Respiratory:  Negative for cough and shortness of breath.   Cardiovascular:  Negative for chest pain, palpitations  and leg swelling.  Gastrointestinal:  Negative for abdominal pain, blood in stool, constipation and diarrhea.  Genitourinary:  Negative for dysuria.  Skin:  Negative for rash.  Neurological:  Negative for dizziness, speech change, seizures, loss of consciousness and headaches.  Psychiatric/Behavioral:  Negative for depression.       Objective:     BP 130/80 (BP Location: Left Arm, Patient Position: Sitting, Cuff Size: Normal)   Pulse 65   Temp 97.9 F (36.6 C) (Oral)   Ht 5' 8.11" (1.73 m)   Wt 194 lb 8 oz (88.2 kg)   SpO2 98%   BMI 29.48 kg/m  BP Readings from Last 3 Encounters:  04/23/23 130/80  08/02/20 110/70  04/29/19 122/70   Wt Readings from Last 3 Encounters:  04/23/23 194 lb 8 oz (88.2 kg)  08/02/20 198 lb (89.8 kg)  04/29/19 194 lb 3.2 oz (88.1 kg)      Physical Exam Vitals reviewed.  Constitutional:      General: He is not in acute distress.    Appearance: He is well-developed. He is not ill-appearing.  HENT:     Head: Normocephalic and atraumatic.     Right Ear: External ear normal.     Left Ear: External ear normal.  Eyes:     Conjunctiva/sclera: Conjunctivae normal.     Pupils: Pupils are equal, round, and reactive to light.  Neck:     Thyroid: No thyromegaly.  Cardiovascular:     Rate and Rhythm: Normal rate and regular rhythm.     Heart sounds: Normal heart sounds. No murmur heard. Pulmonary:     Effort: No respiratory distress.     Breath sounds: No wheezing or rales.  Abdominal:     General: Bowel sounds are normal. There is no distension.     Palpations: Abdomen is soft. There is no mass.     Tenderness: There is no abdominal tenderness. There is no guarding or rebound.  Musculoskeletal:     Cervical back: Normal range of motion and neck supple.  Lymphadenopathy:     Cervical: No cervical adenopathy.  Skin:    Findings: No rash.  Neurological:     Mental Status: He is alert and oriented to person, place, and time.     Cranial Nerves:  No cranial nerve deficit.      No results found for any visits on 04/23/23.    The ASCVD Risk score (Arnett DK, et al., 2019) failed to calculate for the following reasons:   Cannot find a previous HDL lab   Cannot find a previous total cholesterol lab    Assessment & Plan:   Problem List Items Addressed This Visit   None Visit Diagnoses     Encounter for subsequent annual wellness visit (AWV) in Medicare patient    -  Primary     -Recommend flu vaccine and patient consents -We did recommend he consider Shingrix.  He will consider and check on coverage -No definite labs indicated today.  He does have history of mild hyperlipidemia.  Has gotten PSAs previously but not for years.  Discussed pros and cons of PSA screening  No follow-ups on file.    Evelena Peat, MD

## 2024-07-09 ENCOUNTER — Telehealth: Payer: Self-pay | Admitting: *Deleted

## 2024-07-09 MED ORDER — OMEPRAZOLE 40 MG PO CPDR: 40.0000 mg | DELAYED_RELEASE_CAPSULE | Freq: Every day | ORAL | 11 refills | Status: AC

## 2024-07-09 NOTE — Telephone Encounter (Signed)
-----   Message from Norleen Kiang, MD sent at 07/09/2024  1:30 PM EST ----- Regarding: Prescription Dottie, Please call in a prescription for this patient. Omeprazole  40 mg; #30; 1 p.o. daily; 11 refills. [facility] in [place] . Thanks, Dr. Kiang

## 2024-07-09 NOTE — Telephone Encounter (Signed)
 Rx sent.
